# Patient Record
Sex: Male | Born: 1970 | Race: White | Hispanic: No | Marital: Married | Smoking: Never smoker
Health system: Southern US, Community
[De-identification: ages and names within clinical notes are randomized; demographics above are authoritative.]

## PROBLEM LIST (undated history)

## (undated) DIAGNOSIS — E785 Hyperlipidemia, unspecified: Secondary | ICD-10-CM

## (undated) HISTORY — PX: KNEE ARTHROSCOPY: SHX127

## (undated) HISTORY — DX: Hyperlipidemia, unspecified: E78.5

## (undated) HISTORY — PX: SHOULDER SURGERY: SHX246

---

## 2013-03-05 LAB — TESTOSTERONE: TESTOSTERONE: 252

## 2014-03-22 LAB — LIPID PANEL
CHOLESTEROL: 246 mg/dL — AB (ref 0–200)
HDL: 57 mg/dL (ref 35–70)
LDL CALC: 164 mg/dL
TRIGLYCERIDES: 123 mg/dL (ref 40–160)

## 2014-03-22 LAB — BASIC METABOLIC PANEL
Creatinine: 0.9 mg/dL (ref 0.6–1.3)
GLUCOSE: 106 mg/dL
Potassium: 4.2 mmol/L (ref 3.4–5.3)
Sodium: 144 mmol/L (ref 137–147)

## 2015-05-02 ENCOUNTER — Encounter: Payer: Self-pay | Admitting: Family Medicine

## 2015-05-02 ENCOUNTER — Ambulatory Visit (INDEPENDENT_AMBULATORY_CARE_PROVIDER_SITE_OTHER): Payer: BLUE CROSS/BLUE SHIELD | Admitting: Family Medicine

## 2015-05-02 VITALS — BP 129/88 | HR 82 | Ht 68.0 in | Wt 188.0 lb

## 2015-05-02 DIAGNOSIS — R1084 Generalized abdominal pain: Secondary | ICD-10-CM

## 2015-05-02 DIAGNOSIS — R109 Unspecified abdominal pain: Secondary | ICD-10-CM | POA: Insufficient documentation

## 2015-05-02 NOTE — Assessment & Plan Note (Signed)
Abdominal pain. His history is consistent with gastroparesis. He does not have a good reason to have gastroparesis-like diabetes. Plan for a limited laboratory workup and will obtain records from the gastroenterology office to confirm the tests that were done. If he truly does have gastroparesis he may benefit from referral [facility].

## 2015-05-02 NOTE — Progress Notes (Signed)
       Gary Callahan is a 45 y.o. male who [facility] today for Abdominal pain and bloating. Patient has a history years ago of abdominal pain and bloating. He was originally worked up by a gastroenterologist with a upper endoscopy that was reportedly normal and a gastric emptying study that was reportedly abnormal.he thinks he may have been diagnosed with gastroparesis and treated with Reglan. Reglan was effective but caused excessive sedation. Additionally he had some food allergy testing that was equivocal for garlic orange potato and tomato. These tests will be scanned into the chart.  He followed a restricted diet which helped a little. His symptoms continue. He denies severe pain vomiting or diarrhea.He feels well otherwise.   Past Medical History  Diagnosis Date  . Hyperlipidemia    Past Surgical History  Procedure Laterality Date  . Knee arthroscopy Bilateral   . Shoulder surgery Right    Social History  Substance Use Topics  . Smoking status: Never Smoker   . Smokeless tobacco: Not on file  . Alcohol Use: Not on file   family history includes Alzheimer's disease in his father and paternal grandmother; Diabetes in his father; Heart disease in his maternal uncle.  ROS as above: No headache, visual changes, , vomiting, diarrhea, constipation, dizziness, , skin rash, fevers, chills, night sweats, weight loss, swollen lymph nodes, body aches, joint swelling, muscle aches, chest pain, shortness of breath, mood changes, visual or auditory hallucinations.   Medications: Current Outpatient Prescriptions  Medication Sig Dispense Refill  . fluticasone (FLONASE) 50 MCG/ACT nasal spray Place into both nostrils daily.     No current facility-administered medications for this visit.   No Known Allergies   Exam:  BP 129/88 mmHg  Pulse 82  Ht 5\' 8"  (1.727 m)  Wt 188 lb  (85.276 kg)  BMI 28.59 kg/m2 Gen: Well NAD HEENT: EOMI,  MMM Lungs: Normal work of breathing. CTABL Heart: RRR no MRG Abd: NABS, Soft. Nondistended, Nontender Exts: Brisk capillary refill, warm and well perfused.   No results found for this or any previous visit (from the past 24 hour(s)). No results found.   Please see individual assessment and plan sections.

## 2015-05-02 NOTE — Patient Instructions (Signed)
Thank you for coming in today. Get labs,.  Send records.  We will call with results.   Gastroparesis Gastroparesis, also called delayed gastric emptying, is a condition in which food takes longer than normal to empty from the stomach. The condition is usually long-lasting (chronic). CAUSES This condition may be caused by:  An endocrine disorder, such as hypothyroidism or diabetes. Diabetes is the most common cause of this condition.  A nervous system disease, such as Parkinson disease or multiple sclerosis.  Cancer, infection, or surgery of the stomach or vagus nerve.  A connective tissue disorder, such as scleroderma.  Certain medicines. In most cases, the cause is not known. RISK FACTORS This condition is more likely to develop in:  People with certain disorders, including endocrine disorders, eating disorders, amyloidosis, and scleroderma.  People with certain diseases, including Parkinson disease or multiple sclerosis.  People with cancer or infection of the stomach or vagus nerve.  People who have had surgery on the stomach or vagus nerve.  People who take certain medicines.  Women. SYMPTOMS Symptoms of this condition include:  An early feeling of fullness when eating.  Nausea.  Weight loss.  Vomiting.  Heartburn.  Abdominal bloating.  Inconsistent blood glucose levels.  Lack of appetite.  Acid from the stomach coming up into the esophagus (gastroesophageal reflux).  Spasms of the stomach. Symptoms may come and go. DIAGNOSIS This condition is diagnosed with tests, such as:  Tests that check how long it takes food to move through the stomach and intestines. These tests include:  Upper gastrointestinal (GI) series. In this test, X-rays of the intestines are taken after you drink a liquid. The liquid makes the intestines show up better on the X-rays.  Gastric emptying scintigraphy. In this test, scans are taken after you eat food that contains a small  amount of radioactive material.  Wireless capsule GI monitoring system. This test involves swallowing a capsule that records information about movement through the stomach.  Gastric manometry. This test measures electrical and muscular activity in the stomach. It is done with a thin tube that is passed down the throat and into the stomach.  Endoscopy. This test checks for abnormalities in the lining of the stomach. It is done with a long, thin tube that is passed down the throat and into the stomach.  An ultrasound. This test can help rule out gallbladder disease or pancreatitis as a cause of your symptoms. It uses sound waves to take pictures of the inside of your body. TREATMENT There is no cure for gastroparesis. This condition may be managed with:  Treatment of the underlying condition causing the gastroparesis.  Lifestyle changes, including exercise and dietary changes. Dietary changes can include:  Changes in what and when you eat.  Eating smaller meals more often.  Eating low-fat foods.  Eating low-fiber forms of high-fiber foods, such as cooked vegetables instead of raw vegetables.  Having liquid foods in place of solid foods. Liquid foods are easier to digest.  Medicines. These may be given to control nausea and vomiting and to stimulate stomach muscles.  Getting food through a feeding tube. This may be done in severe cases.  A gastric neurostimulator. This is a device that is inserted into the body with surgery. It helps improve stomach emptying and control nausea and vomiting. HOME CARE INSTRUCTIONS  Follow your health care provider's instructions about exercise and diet.  Take medicines only as directed by your health care provider. SEEK MEDICAL CARE IF:  Your  symptoms do not improve with treatment.  You have new symptoms. SEEK IMMEDIATE MEDICAL CARE IF:  You have severe abdominal pain that does not improve with treatment.  You have nausea that does not go  away.  You cannot keep fluids down.   This information is not intended to replace advice given to you by your health care provider. Make sure you discuss any questions you have with your health care provider.   Document Released: 12/31/2004 Document Revised: 05/17/2014 Document Reviewed: 12/27/2013 Elsevier Interactive Patient Education [employer]

## 2015-05-04 LAB — LIPID PANEL
CHOLESTEROL: 270 mg/dL — AB (ref 125–200)
HDL: 61 mg/dL (ref >=40)
LDL Cholesterol: 192 mg/dL — ABNORMAL HIGH (ref <130)
TRIGLYCERIDES: 87 mg/dL (ref <150)
Total CHOL/HDL Ratio: 4.4 Ratio (ref <=5.0)
VLDL: 17 mg/dL (ref <30)

## 2015-05-04 LAB — COMPREHENSIVE METABOLIC PANEL
ALBUMIN: 4.2 g/dL (ref 3.6–5.1)
ALT: 36 U/L (ref 9–46)
AST: 22 U/L (ref 10–40)
Alkaline Phosphatase: 93 U/L (ref 40–115)
BUN: 18 mg/dL (ref 7–25)
CHLORIDE: 104 mmol/L (ref 98–110)
CO2: 25 mmol/L (ref 20–31)
CREATININE: 1.01 mg/dL (ref 0.60–1.35)
Calcium: 9.5 mg/dL (ref 8.6–10.3)
Glucose, Bld: 111 mg/dL — ABNORMAL HIGH (ref 65–99)
Potassium: 4.1 mmol/L (ref 3.5–5.3)
SODIUM: 140 mmol/L (ref 135–146)
TOTAL PROTEIN: 6.7 g/dL (ref 6.1–8.1)
Total Bilirubin: 0.7 mg/dL (ref 0.2–1.2)

## 2015-05-04 LAB — CBC
HCT: 45.6 % (ref 38.5–50.0)
HEMOGLOBIN: 15.6 g/dL (ref 13.2–17.1)
MCH: 31.5 pg (ref 27.0–33.0)
MCHC: 34.2 g/dL (ref 32.0–36.0)
MCV: 92.1 fL (ref 80.0–100.0)
MPV: 11.2 fL (ref 7.5–12.5)
Platelets: 133 10*3/uL — ABNORMAL LOW (ref 140–400)
RBC: 4.95 MIL/uL (ref 4.20–5.80)
RDW: 13.6 % (ref 11.0–15.0)
WBC: 5.4 10*3/uL (ref 3.8–10.8)

## 2015-05-04 LAB — LIPASE: Lipase: 5 U/L — ABNORMAL LOW (ref 7–60)

## 2015-05-04 LAB — HEMOGLOBIN A1C
Hgb A1c MFr Bld: 5.8 % — ABNORMAL HIGH (ref <5.7)
MEAN PLASMA GLUCOSE: 120 mg/dL

## 2015-05-04 LAB — TSH: TSH: 0.84 m[IU]/L (ref 0.40–4.50)

## 2015-05-05 ENCOUNTER — Encounter: Payer: Self-pay | Admitting: Family Medicine

## 2015-05-05 DIAGNOSIS — R7303 Prediabetes: Secondary | ICD-10-CM | POA: Insufficient documentation

## 2015-05-05 DIAGNOSIS — E559 Vitamin D deficiency, unspecified: Secondary | ICD-10-CM | POA: Insufficient documentation

## 2015-05-05 DIAGNOSIS — E785 Hyperlipidemia, unspecified: Secondary | ICD-10-CM | POA: Insufficient documentation

## 2015-05-05 LAB — VITAMIN D 25 HYDROXY (VIT D DEFICIENCY, FRACTURES): VIT D 25 HYDROXY: 23 ng/mL — AB (ref 30–100)

## 2015-05-05 MED ORDER — ATORVASTATIN CALCIUM 20 MG PO TABS: 20.0000 mg | ORAL_TABLET | Freq: Every day | ORAL | Status: DC

## 2015-05-05 NOTE — Addendum Note (Signed)
Addended by: Rodolph BongOREY, Maurissa Ambrose S on: 05/05/2015 07:29 AM   Modules accepted: Orders

## 2015-05-05 NOTE — Progress Notes (Signed)
Quick Note:  1) Vitamin D deficiency noted. Take 2000 units of vitamin D daily over-the-counter.  2) You have prediabetes. Work on a low carb diet and weight loss.  3) Cholesterol is pretty bad. Bad cholesterol is 192. It should be less than about 120 or so. I think we should start cholesterol medicine called atorvistatin. I have called this into your pharmacy.  4) Other labs were all pretty normal. ______

## 2015-05-10 ENCOUNTER — Telehealth: Payer: Self-pay

## 2015-05-10 NOTE — Telephone Encounter (Signed)
Completed and faxed biometric screening to [phone].

## 2015-06-13 ENCOUNTER — Telehealth: Payer: Self-pay

## 2015-06-13 DIAGNOSIS — R1084 Generalized abdominal pain: Secondary | ICD-10-CM

## 2015-06-13 NOTE — Telephone Encounter (Signed)
I called the patient back. Plan to refer to local GI office for further evaluation and management. He may benefit from functional GI evaluation.

## 2015-06-13 NOTE — Telephone Encounter (Signed)
Pt called questioning is you had the chance to review records we received from his former gastronterology office regarding abdominal pain and possibly gastroparesis. If so he would like to know what the plan is going forward. Please advise.

## 2015-06-16 ENCOUNTER — Encounter: Payer: Self-pay | Admitting: Family Medicine

## 2015-06-23 ENCOUNTER — Encounter: Payer: Self-pay | Admitting: Family Medicine

## 2015-06-29 ENCOUNTER — Other Ambulatory Visit: Payer: Self-pay | Admitting: Family Medicine

## 2015-07-10 ENCOUNTER — Encounter: Payer: Self-pay | Admitting: Family Medicine

## 2015-07-10 ENCOUNTER — Ambulatory Visit (INDEPENDENT_AMBULATORY_CARE_PROVIDER_SITE_OTHER): Payer: BLUE CROSS/BLUE SHIELD | Admitting: Family Medicine

## 2015-07-10 VITALS — BP 113/70 | HR 79 | Wt 189.0 lb

## 2015-07-10 DIAGNOSIS — E785 Hyperlipidemia, unspecified: Secondary | ICD-10-CM | POA: Diagnosis not present

## 2015-07-10 DIAGNOSIS — F321 Major depressive disorder, single episode, moderate: Secondary | ICD-10-CM

## 2015-07-10 DIAGNOSIS — R1084 Generalized abdominal pain: Secondary | ICD-10-CM | POA: Diagnosis not present

## 2015-07-10 DIAGNOSIS — E559 Vitamin D deficiency, unspecified: Secondary | ICD-10-CM

## 2015-07-10 DIAGNOSIS — F329 Major depressive disorder, single episode, unspecified: Secondary | ICD-10-CM | POA: Insufficient documentation

## 2015-07-10 LAB — COMPREHENSIVE METABOLIC PANEL
ALBUMIN: 4.3 g/dL (ref 3.6–5.1)
ALT: 37 U/L (ref 9–46)
AST: 18 U/L (ref 10–40)
Alkaline Phosphatase: 90 U/L (ref 40–115)
BILIRUBIN TOTAL: 0.6 mg/dL (ref 0.2–1.2)
BUN: 18 mg/dL (ref 7–25)
CO2: 27 mmol/L (ref 20–31)
CREATININE: 0.85 mg/dL (ref 0.60–1.35)
Calcium: 9 mg/dL (ref 8.6–10.3)
Chloride: 107 mmol/L (ref 98–110)
GLUCOSE: 113 mg/dL — AB (ref 65–99)
Potassium: 4.3 mmol/L (ref 3.5–5.3)
SODIUM: 143 mmol/L (ref 135–146)
Total Protein: 6.4 g/dL (ref 6.1–8.1)

## 2015-07-10 LAB — CBC
HCT: 44.9 % (ref 38.5–50.0)
HEMOGLOBIN: 15 g/dL (ref 13.2–17.1)
MCH: 31.1 pg (ref 27.0–33.0)
MCHC: 33.4 g/dL (ref 32.0–36.0)
MCV: 93 fL (ref 80.0–100.0)
MPV: 10.9 fL (ref 7.5–12.5)
Platelets: 121 10*3/uL — ABNORMAL LOW (ref 140–400)
RBC: 4.83 MIL/uL (ref 4.20–5.80)
RDW: 13.3 % (ref 11.0–15.0)
WBC: 5.8 10*3/uL (ref 3.8–10.8)

## 2015-07-10 LAB — LIPID PANEL
Cholesterol: 170 mg/dL (ref 125–200)
HDL: 59 mg/dL (ref >=40)
LDL CALC: 94 mg/dL (ref <130)
TRIGLYCERIDES: 87 mg/dL (ref <150)
Total CHOL/HDL Ratio: 2.9 Ratio (ref <=5.0)
VLDL: 17 mg/dL (ref <30)

## 2015-07-10 MED ORDER — SERTRALINE HCL 50 MG PO TABS: ORAL_TABLET | ORAL | Status: DC

## 2015-07-10 NOTE — Progress Notes (Signed)
       Gary Callahan is a 45 y.o. male who [facility] Medicine today for   Cholesterol: Patient notes he tolerates his atorvastatin. He denies any muscle aches or pain.  Abdominal pain: Patient was seen by digestive health specialists. He has a gastric emptying study pending. He's feeling better with no treatment.  Depression: Patient has fatigue and tiredness and difficulty sleeping. He also notes decreased pleasure normal activities. He feels anxious on average and has difficulty controlling worrying. Never been diagnosed with depression but notes his symptoms have made it very difficult to cope with his normal life. He notes work as a back stress or for him.  Past Medical History  Diagnosis Date  . Hyperlipidemia    Past Surgical History  Procedure Laterality Date  . Knee arthroscopy Bilateral   . Shoulder surgery Right    Social History  Substance Use Topics  . Smoking status: Never Smoker   . Smokeless tobacco: Not on file  . Alcohol Use: Not on file   family history includes Alzheimer's disease in his father and paternal grandmother; Diabetes in his father; Heart disease in his maternal uncle.  ROS as above:  Medications: Current Outpatient Prescriptions  Medication Sig Dispense Refill  . atorvastatin (LIPITOR) 20 MG tablet TAKE ONE TABLET BY MOUTH EVERY DAY 30 tablet 0  . Cholecalciferol (VITAMIN D) 2000 units tablet Take 2,000 Units by mouth daily.    . fluticasone (FLONASE) 50 MCG/ACT nasal spray Place into both nostrils daily.    . sertraline (ZOLOFT) 50 MG tablet Take 1/2 tab po qd x 1 week the increase to 1 tab po qd 30 tablet 0   No current facility-administered medications for this visit.   No Known Allergies   Exam:  BP 113/70 mmHg  Pulse 79  Wt 189 lb (85.73 kg) Gen: Well NAD HEENT: EOMI,  MMM Lungs: Normal work of breathing.  CTABL Heart: RRR no MRG Abd: NABS, Soft. Nondistended, Nontender Exts: Brisk capillary refill, warm and well perfused.  Psych: Alert and oriented normal speech thought process and affect.  Depression screen PHQ 2/9 07/10/2015  Decreased Interest 2  Down, Depressed, Hopeless 1  PHQ - 2 Score 3  Altered sleeping 3  Tired, decreased energy 3  Change in appetite 2  Feeling bad or failure about yourself  1  Trouble concentrating 2  Moving slowly or fidgety/restless 0  Suicidal thoughts 0  PHQ-9 Score 14  Difficult doing work/chores Very difficult    GAD 7 is 11    No results found for this or any previous visit (from the past 24 hour(s)). No results found.    Assessment and Plan: 45 y.o. male with  1) hyperlipidemia: Doing well. Check fasting lipids today as well as CBC and CMP.  2) abdominal pain: Per GI. 3) depression and anxiety: New diagnosis today. Moderate. Started Zoloft refer to counseling recheck in 2 weeks. 4) vitamin D deficiency: Recheck vitamin D  Discussed warning signs or symptoms. Please see discharge instructions. Patient expresses understanding.

## 2015-07-10 NOTE — Patient Instructions (Signed)
Thank you for coming in today. Take zoloft 1/2 tab for 1 week.  Increase to 1 tab after 1 week.  Return in 2 weeks.   Major Depressive Disorder Major depressive disorder is a mental illness. It also may be called clinical depression or unipolar depression. Major depressive disorder usually causes feelings of sadness, hopelessness, or helplessness. Some people with this disorder do not feel particularly sad but lose interest in doing things they used to enjoy (anhedonia). Major depressive disorder also can cause physical symptoms. It can interfere with work, school, relationships, and other normal everyday activities. The disorder varies in severity but is longer lasting and more serious than the sadness we all feel from time to time in our lives. Major depressive disorder often is triggered by stressful life events or major life changes. Examples of these triggers include divorce, loss of your job or home, a move, and the death of a family member or close friend. Sometimes this disorder occurs for no obvious reason at all. People who have family members with major depressive disorder or bipolar disorder are at higher risk for developing this disorder, with or without life stressors. Major depressive disorder can occur at any age. It may occur just once in your life (single episode major depressive disorder). It may occur multiple times (recurrent major depressive disorder). SYMPTOMS People with major depressive disorder have either anhedonia or depressed mood on nearly a daily basis for at least 2 weeks or longer. Symptoms of depressed mood include:  Feelings of sadness (blue or down in the dumps) or emptiness.  Feelings of hopelessness or helplessness.  Tearfulness or episodes of crying (may be observed by others).  Irritability (children and adolescents). In addition to depressed mood or anhedonia or both, people with this disorder have at least four of the following symptoms:  Difficulty  sleeping or sleeping too much.   Significant change (increase or decrease) in appetite or weight.   Lack of energy or motivation.  Feelings of guilt and worthlessness.   Difficulty concentrating, remembering, or making decisions.  Unusually slow movement (psychomotor retardation) or restlessness (as observed by others).   Recurrent wishes for death, recurrent thoughts of self-harm (suicide), or a suicide attempt. People with major depressive disorder commonly have persistent negative thoughts about themselves, other people, and the world. People with severe major depressive disorder may experiencedistorted beliefs or perceptions about the world (psychotic delusions). They also may see or hear things that are not real (psychotic hallucinations). DIAGNOSIS Major depressive disorder is diagnosed through an assessment by your health care provider. Your health care provider will ask aboutaspects of your daily life, such as mood,sleep, and appetite, to see if you have the diagnostic symptoms of major depressive disorder. Your health care provider may ask about your medical history and use of alcohol or drugs, including prescription medicines. Your health care provider also may do a physical exam and blood work. This is because certain medical conditions and the use of certain substances can cause major depressive disorder-like symptoms (secondary depression). Your health care provider also may refer you to a mental health specialist for further evaluation and treatment. TREATMENT It is important to recognize the symptoms of major depressive disorder and seek treatment. The following treatments can be prescribed for this disorder:   Medicine. Antidepressant medicines usually are prescribed. Antidepressant medicines are thought to correct chemical imbalances in the brain that are commonly associated with major depressive disorder. Other types of medicine may be added if the symptoms do not  respond  to antidepressant medicines alone or if psychotic delusions or hallucinations occur.  Talk therapy. Talk therapy can be helpful in treating major depressive disorder by providing support, education, and guidance. Certain types of talk therapy also can help with negative thinking (cognitive behavioral therapy) and with relationship issues that trigger this disorder (interpersonal therapy). A mental health specialist can help determine which treatment is best for you. Most people with major depressive disorder do well with a combination of medicine and talk therapy. Treatments involving electrical stimulation of the brain can be used in situations with extremely severe symptoms or when medicine and talk therapy do not work over time. These treatments include electroconvulsive therapy, transcranial magnetic stimulation, and vagal nerve stimulation.   This information is not intended to replace advice given to you by your health care provider. Make sure you discuss any questions you have with your health care provider.   Document Released: 04/27/2012 Document Revised: 01/21/2014 Document Reviewed: 04/27/2012 Elsevier Interactive Patient Education [employer] Inc[employer]

## 2015-07-11 ENCOUNTER — Encounter: Payer: Self-pay | Admitting: Family Medicine

## 2015-07-11 DIAGNOSIS — D696 Thrombocytopenia, unspecified: Secondary | ICD-10-CM | POA: Insufficient documentation

## 2015-07-11 LAB — VITAMIN D 25 HYDROXY (VIT D DEFICIENCY, FRACTURES): Vit D, 25-Hydroxy: 42 ng/mL (ref 30–100)

## 2015-07-11 MED ORDER — ATORVASTATIN CALCIUM 20 MG PO TABS: 20.0000 mg | ORAL_TABLET | Freq: Every day | ORAL | Status: DC

## 2015-07-11 NOTE — Addendum Note (Signed)
Addended by: Rodolph BongOREY, Rakim Moone S on: 07/11/2015 05:20 AM   Modules accepted: Orders

## 2015-07-11 NOTE — Progress Notes (Signed)
Quick Note:  Labs are much better. Cholesterol is normal. Kidney and liver are doing well. Vit D is normal.  Platelets are still a bit low. We will continue to follow along. ______

## 2015-07-25 ENCOUNTER — Ambulatory Visit (INDEPENDENT_AMBULATORY_CARE_PROVIDER_SITE_OTHER): Payer: BLUE CROSS/BLUE SHIELD | Admitting: Family Medicine

## 2015-07-25 ENCOUNTER — Encounter: Payer: Self-pay | Admitting: Family Medicine

## 2015-07-25 VITALS — BP 121/77 | HR 69 | Wt 185.0 lb

## 2015-07-25 DIAGNOSIS — G4700 Insomnia, unspecified: Secondary | ICD-10-CM | POA: Diagnosis not present

## 2015-07-25 DIAGNOSIS — F321 Major depressive disorder, single episode, moderate: Secondary | ICD-10-CM

## 2015-07-25 MED ORDER — TRAZODONE HCL 50 MG PO TABS: 25.0000 mg | ORAL_TABLET | Freq: Every evening | ORAL | Status: DC | PRN

## 2015-07-25 MED ORDER — SERTRALINE HCL 100 MG PO TABS: ORAL_TABLET | ORAL | Status: DC

## 2015-07-25 NOTE — Progress Notes (Signed)
       Gary Callahan is a 45 y.o. male who [facility] Medicine today for follow-up depression and anxiety. Patient was seen several weeks ago and started on Zoloft. He is increased to 50 mg of Zoloft and feels much better. He still notes some difficulty falling asleep and feels tired. Overall he notes decreased irritability. He denies any significant side effects and feels quite well.  Additionally he does note some mild difficulty with concentrating. He notes this has been going on since he started the Zoloft and is not sure if it's related to the medication or the effects of his depression.   Past Medical History  Diagnosis Date  . Hyperlipidemia    Past Surgical History  Procedure Laterality Date  . Knee arthroscopy Bilateral   . Shoulder surgery Right    Social History  Substance Use Topics  . Smoking status: Never Smoker   . Smokeless tobacco: Not on file  . Alcohol Use: Not on file   family history includes Alzheimer's disease in his father and paternal grandmother; Diabetes in his father; Heart disease in his maternal uncle.  ROS as above:  Medications: Current Outpatient Prescriptions  Medication Sig Dispense Refill  . atorvastatin (LIPITOR) 20 MG tablet Take 1 tablet (20 mg total) by mouth daily. 90 tablet 3  . Cholecalciferol (VITAMIN D) 2000 units tablet Take 2,000 Units by mouth daily.    . fluticasone (FLONASE) 50 MCG/ACT nasal spray Place into both nostrils daily.    . sertraline (ZOLOFT) 100 MG tablet Take 1/2 tab po qd x 1 week the increase to 1 tab po qd 100 tablet 1  . traZODone (DESYREL) 50 MG tablet Take 0.5-1 tablets (25-50 mg total) by mouth at bedtime as needed for sleep. 30 tablet 3   No current facility-administered medications for this visit.   No Known Allergies   Exam:  BP 121/77 mmHg  Pulse 69  Wt 185 lb (83.915 kg) Gen: Well  NAD HEENT: EOMI,  MMM Lungs: Normal work of breathing. CTABL Heart: RRR no MRG Abd: NABS, Soft. Nondistended, Nontender Exts: Brisk capillary refill, warm and well perfused.  Psych: Alert and oriented normal speech thought process and affect.  [facility] 2/[facility] 07/25/2015 07/10/2015  Decreased Interest 1 2  Down, Depressed, Hopeless 1 1  PHQ - 2 Score 2 3  Altered sleeping 3 3  Tired, decreased energy 3 3  Change in appetite 0 2  Feeling bad or failure about yourself  0 1  Trouble concentrating 1 2  Moving slowly or fidgety/restless 0 0  Suicidal thoughts 0 0  PHQ-9 Score 9 14  Difficult doing work/chores Somewhat difficult Very difficult    GAD 7 is 3  No results found for this or any previous visit (from the past 24 hour(s)). No results found.    Assessment and Plan: 45 y.o. male with improved anxiety and depression with Zoloft. Plan to increase Zoloft to 100 mg and recheck in 1 month. I'm not sure if the difficulty concentrating and is due to medication or depression. Will increase Zoloft as above and continue to follow.  Additionally patient notes insomnia that has not improved. (New issue today). We'll use a trial of trazodone at bedtime. Recheck in one month.  Discussed warning signs or symptoms. Please see discharge instructions. Patient expresses understanding.

## 2015-07-25 NOTE — Patient Instructions (Signed)
Thank you for coming in today. Increase zoloft to 100 ina few days.  Use trazodone at bedtime as needed.  Return in 1 month.

## 2015-08-05 ENCOUNTER — Other Ambulatory Visit: Payer: Self-pay | Admitting: Family Medicine

## 2015-08-28 ENCOUNTER — Encounter: Payer: Self-pay | Admitting: Family Medicine

## 2015-08-28 ENCOUNTER — Ambulatory Visit (INDEPENDENT_AMBULATORY_CARE_PROVIDER_SITE_OTHER): Payer: BLUE CROSS/BLUE SHIELD | Admitting: Family Medicine

## 2015-08-28 VITALS — BP 129/84 | HR 78 | Wt 183.0 lb

## 2015-08-28 DIAGNOSIS — G4700 Insomnia, unspecified: Secondary | ICD-10-CM

## 2015-08-28 DIAGNOSIS — F321 Major depressive disorder, single episode, moderate: Secondary | ICD-10-CM | POA: Diagnosis not present

## 2015-08-28 MED ORDER — SERTRALINE HCL 100 MG PO TABS: 100.0000 mg | ORAL_TABLET | Freq: Every day | ORAL | 1 refills | Status: DC

## 2015-08-28 MED ORDER — TRAZODONE HCL 100 MG PO TABS: 50.0000 mg | ORAL_TABLET | Freq: Every evening | ORAL | 1 refills | Status: DC | PRN

## 2015-08-28 NOTE — Progress Notes (Signed)
       Gary Callahan is a 45 y.o. male who [facility] today for follow-up insomnia and depression.  Insomnia: Improved but still not quite at goal. He notes he is able to stay asleep with 50 mg of trazodone but has trouble falling asleep. He notes he tends to watch TV just prior to bed. He feels tired in the morning. No fevers or chills nausea vomiting or diarrhea.  Depression: Much better. Patient takes Zoloft 100 mg daily. No significant side effects. He feels as though he is much less irritable.   Past Medical History:  Diagnosis Date  . Hyperlipidemia    Past Surgical History:  Procedure Laterality Date  . KNEE ARTHROSCOPY Bilateral   . SHOULDER SURGERY Right    Social History  Substance Use Topics  . Smoking status: Never Smoker  . Smokeless tobacco: Not on file  . Alcohol use Not on file   family history includes Alzheimer's disease in his father and paternal grandmother; Diabetes in his father; Heart disease in his maternal uncle.   ROS as above:  Medications: Current Outpatient Prescriptions  Medication Sig Dispense Refill  . atorvastatin (LIPITOR) 20 MG tablet Take 1 tablet (20 mg total) by mouth daily. 90 tablet 3  . Cholecalciferol (VITAMIN D) 2000 units tablet Take 2,000 Units by mouth daily.    . fluticasone (FLONASE) 50 MCG/ACT nasal spray Place into both nostrils daily.    . sertraline (ZOLOFT) 100 MG tablet Take 1 tablet (100 mg total) by mouth daily. 90 tablet 1  . traZODone (DESYREL) 100 MG tablet Take 0.5-1 tablets (50-100 mg total) by mouth at bedtime as needed for sleep. 30 tablet 1   No current facility-administered medications for this visit.    No Known Allergies   Exam:  BP 129/84   Pulse 78   Wt 183 lb (83 kg)   BMI 27.83 kg/m  Gen: Well NAD Psych: Alert and oriented normal speech thought process and  affect  Dep[facility] screen [facility] 08/28/2015 07/25/2015 07/10/2015  Decreased Interest 1 1 2   Down, Depressed, Hopeless 0 1 1  PHQ - 2 Score 1 2 3   Altered sleeping 2 3 3   Tired, decreased energy 1 3 3   Change in appetite 0 0 2  Feeling bad or failure about yourself  0 0 1  Trouble concentrating 0 1 2  Moving slowly or fidgety/restless 0 0 0  Suicidal thoughts 0 0 0  PHQ-9 Score 4 9 14   Difficult doing work/chores - Somewhat difficult Very difficult    GAD 7 : Generalized Anxiety Score 08/28/2015  Nervous, Anxious, on Edge 0  Control/stop worrying 0  Worry too much - different things 0  Trouble relaxing 1  Restless 0  Easily annoyed or irritable 0  Afraid - awful might happen 0  Total GAD 7 Score 1  Anxiety Difficulty Not difficult at all      No results found for this or any previous visit (from the past 24 hour(s)). No results found.    Assessment and Plan: 45 y.o. male with  Depression: Much improved. Continue current regimen.  Insomnia improved but not yet at goal. Work on sleep hygiene and increase trazodone to 100 mg at night. Recheck in one month if not better.   No orders of the defined types were placed in this encounter.   Discussed warning signs or symptoms. Please see discharge instructions. Patient expresses understanding.

## 2015-08-28 NOTE — Patient Instructions (Signed)
Thank you for coming in today. Increase trazodone to 100mg  at night.  Continue zoloft. Return in 1 month if not better  Work on sleep hygiene.   Insomnia Insomnia is a sleep disorder that makes it difficult to fall asleep or to stay asleep. Insomnia can cause tiredness (fatigue), low energy, difficulty concentrating, mood swings, and poor performance at work or school.  There are three different ways to classify insomnia:  Difficulty falling asleep.  Difficulty staying asleep.  Waking up too early in the morning. Any type of insomnia can be long-term (chronic) or short-term (acute). Both are common. Short-term insomnia usually lasts for three months or less. Chronic insomnia occurs at least three times a week for longer than three months. CAUSES  Insomnia may be caused by another condition, situation, or substance, such as:  Anxiety.  Certain medicines.  Gastroesophageal reflux disease (GERD) or other gastrointestinal conditions.  Asthma or other breathing conditions.  Restless legs syndrome, sleep apnea, or other sleep disorders.  Chronic pain.  Menopause. This may include hot flashes.  Stroke.  Abuse of alcohol, tobacco, or illegal drugs.  Depression.  Caffeine.   Neurological disorders, such as Alzheimer disease.  An overactive thyroid (hyperthyroidism). The cause of insomnia may not be known. RISK FACTORS Risk factors for insomnia include:  Gender. Women are more commonly affected than men.  Age. Insomnia is more common as you get older.  Stress. This may involve your professional or personal life.  Income. Insomnia is more common in people with lower income.  Lack of exercise.   Irregular work schedule or night shifts.  Traveling between different time zones. SIGNS AND SYMPTOMS If you have insomnia, trouble falling asleep or trouble staying asleep is the main symptom. This may lead to other symptoms, such as:  Feeling fatigued.  Feeling  nervous about going to sleep.  Not feeling rested in the morning.  Having trouble concentrating.  Feeling irritable, anxious, or depressed. TREATMENT  Treatment for insomnia depends on the cause. If your insomnia is caused by an underlying condition, treatment will focus on addressing the condition. Treatment may also include:   Medicines to help you sleep.  Counseling or therapy.  Lifestyle adjustments. HOME CARE INSTRUCTIONS   Take medicines only as directed by your health care provider.  Keep regular sleeping and waking hours. Avoid naps.  Keep a sleep diary to help you and your health care provider figure out what could be causing your insomnia. Include:   When you sleep.  When you wake up during the night.  How well you sleep.   How rested you feel the next day.  Any side effects of medicines you are taking.  What you eat and drink.   Make your bedroom a comfortable place where it is easy to fall asleep:  Put up shades or special blackout curtains to block light from outside.  Use a white noise machine to block noise.  Keep the temperature cool.   Exercise regularly as directed by your health care provider. Avoid exercising right before bedtime.  Use relaxation techniques to manage stress. Ask your health care provider to suggest some techniques that may work well for you. These may include:  Breathing exercises.  Routines to release muscle tension.  Visualizing peaceful scenes.  Cut back on alcohol, caffeinated beverages, and cigarettes, especially close to bedtime. These can disrupt your sleep.  Do not overeat or eat spicy foods right before bedtime. This can lead to digestive discomfort that can make it  hard for you to sleep.  Limit screen use before bedtime. This includes:  Watching TV.  Using your smartphone, tablet, and computer.  Stick to a routine. This can help you fall asleep faster. Try to do a quiet activity, brush your teeth, and go  to bed at the same time each night.  Get out of bed if you are still awake after 15 minutes of trying to sleep. Keep the lights down, but try reading or doing a quiet activity. When you feel sleepy, go back to bed.  Make sure that you drive carefully. Avoid driving if you feel very sleepy.  Keep all follow-up appointments as directed by your health care provider. This is important. SEEK MEDICAL CARE IF:   You are tired throughout the day or have trouble in your daily routine due to sleepiness.  You continue to have sleep problems or your sleep problems get worse. SEEK IMMEDIATE MEDICAL CARE IF:   You have serious thoughts about hurting yourself or someone else.   This information is not intended to replace advice given to you by your health care provider. Make sure you discuss any questions you have with your health care provider.   Document Released: 12/29/1999 Document Revised: 09/21/2014 Document Reviewed: 10/01/2013 Elsevier Interactive Patient Education [employer]

## 2015-11-10 ENCOUNTER — Ambulatory Visit (INDEPENDENT_AMBULATORY_CARE_PROVIDER_SITE_OTHER): Payer: BLUE CROSS/BLUE SHIELD | Admitting: Family Medicine

## 2015-11-10 ENCOUNTER — Encounter: Payer: Self-pay | Admitting: Family Medicine

## 2015-11-10 DIAGNOSIS — M7581 Other shoulder lesions, right shoulder: Secondary | ICD-10-CM

## 2015-11-10 DIAGNOSIS — M25519 Pain in unspecified shoulder: Secondary | ICD-10-CM | POA: Insufficient documentation

## 2015-11-10 DIAGNOSIS — M899 Disorder of bone, unspecified: Secondary | ICD-10-CM

## 2015-11-10 DIAGNOSIS — M25512 Pain in left shoulder: Secondary | ICD-10-CM
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
## 2015-11-10 NOTE — Patient Instructions (Signed)
Thank you for coming in today. Attend PT.  Return in 1 month or sooner if needed for should pain recheck.    Impingement Syndrome, Rotator Cuff, Bursitis With Rehab Impingement syndrome is a condition that involves inflammation of the tendons of the rotator cuff and the subacromial bursa, that causes pain in the shoulder. The rotator cuff consists of four tendons and muscles that control much of the shoulder and upper arm function. The subacromial bursa is a fluid filled sac that helps reduce friction between the rotator cuff and one of the bones of the shoulder (acromion). Impingement syndrome is usually an overuse injury that causes swelling of the bursa (bursitis), swelling of the tendon (tendonitis), and/or a tear of the tendon (strain). Strains are classified into three categories. Grade 1 strains cause pain, but the tendon is not lengthened. Grade 2 strains include a lengthened ligament, due to the ligament being stretched or partially ruptured. With grade 2 strains there is still function, although the function may be decreased. Grade 3 strains include a complete tear of the tendon or muscle, and function is usually impaired. SYMPTOMS   Pain around the shoulder, often at the outer portion of the upper arm.  Pain that gets worse with shoulder function, especially when reaching overhead or lifting.  Sometimes, aching when not using the arm.  Pain that wakes you up at night.  Sometimes, tenderness, swelling, warmth, or redness over the affected area.  Loss of strength.  Limited motion of the shoulder, especially reaching behind the back (to the back pocket or to unhook bra) or across your body.  Crackling sound (crepitation) when moving the arm.  Biceps tendon pain and inflammation (in the front of the shoulder). Worse when bending the elbow or lifting. CAUSES  Impingement syndrome is often an overuse injury, in which chronic (repetitive) motions cause the tendons or bursa to become  inflamed. A strain occurs when a force is paced on the tendon or muscle that is greater than it can withstand. Common mechanisms of injury include: Stress from sudden increase in duration, frequency, or intensity of training.  Direct hit (trauma) to the shoulder.  Aging, erosion of the tendon with normal use.  Bony bump on shoulder (acromial spur). RISK INCREASES WITH:  Contact sports (football, wrestling, boxing).  Throwing sports (baseball, tennis, volleyball).  Weightlifting and bodybuilding.  Heavy labor.  Previous injury to the rotator cuff, including impingement.  Poor shoulder strength and flexibility.  Failure to warm up properly before activity.  Inadequate protective equipment.  Old age.  Bony bump on shoulder (acromial spur). PREVENTION   Warm up and stretch properly before activity.  Allow for adequate recovery between workouts.  Maintain physical fitness:  Strength, flexibility, and endurance.  Cardiovascular fitness.  Learn and use proper exercise technique. PROGNOSIS  If treated properly, impingement syndrome usually goes away within 6 weeks. Sometimes surgery is required.  RELATED COMPLICATIONS   Longer healing time if not properly treated, or if not given enough time to heal.  Recurring symptoms, that result in a chronic condition.  Shoulder stiffness, frozen shoulder, or loss of motion.  Rotator cuff tendon tear.  Recurring symptoms, especially if activity is resumed too soon, with overuse, with a direct blow, or when using poor technique. TREATMENT  Treatment first involves the use of ice and medicine, to reduce pain and inflammation. The use of strengthening and stretching exercises may help reduce pain with activity. These exercises may be performed at home or with a therapist. If  non-surgical treatment is unsuccessful after more than 6 months, surgery may be advised. After surgery and rehabilitation, activity is usually possible in 3  months.  MEDICATION  If pain medicine is needed, nonsteroidal anti-inflammatory medicines (aspirin and ibuprofen), or other minor pain relievers (acetaminophen), are often advised.  Do not take pain medicine for 7 days before surgery.  Prescription pain relievers may be given, if your caregiver thinks they are needed. Use only as directed and only as much as you need.  Corticosteroid injections may be given by your caregiver. These injections should be reserved for the most serious cases, because they may only be given a certain number of times. HEAT AND COLD  Cold treatment (icing) should be applied for 10 to 15 minutes every 2 to 3 hours for inflammation and pain, and immediately after activity that aggravates your symptoms. Use ice packs or an ice massage.  Heat treatment may be used before performing stretching and strengthening activities prescribed by your caregiver, physical therapist, or athletic trainer. Use a heat pack or a warm water soak. SEEK MEDICAL CARE IF:   Symptoms get worse or do not improve in 4 to 6 weeks, despite treatment.  New, unexplained symptoms develop. (Drugs used in treatment may produce side effects.) EXERCISES  RANGE OF MOTION (ROM) AND STRETCHING EXERCISES - Impingement Syndrome (Rotator Cuff  Tendinitis, Bursitis) These exercises may help you when beginning to rehabilitate your injury. Your symptoms may go away with or without further involvement from your physician, physical therapist or athletic trainer. While completing these exercises, remember:   Restoring tissue flexibility helps normal motion to return to the joints. This allows healthier, less painful movement and activity.  An effective stretch should be held for at least 30 seconds.  A stretch should never be painful. You should only feel a gentle lengthening or release in the stretched tissue. STRETCH - Flexion, Standing  Stand with good posture. With an underhand grip on your right / left  hand, and an overhand grip on the opposite hand, grasp a broomstick or cane so that your hands are a little more than shoulder width apart.  Keeping your right / left elbow straight and shoulder muscles relaxed, push the stick with your opposite hand, to raise your right / left arm in front of your body and then overhead. Raise your arm until you feel a stretch in your right / left shoulder, but before you have increased shoulder pain.  Try to avoid shrugging your right / left shoulder as your arm rises, by keeping your shoulder blade tucked down and toward your mid-back spine. Hold for __________ seconds.  Slowly return to the starting position. Repeat __________ times. Complete this exercise __________ times per day. STRETCH - Abduction, Supine  Lie on your back. With an underhand grip on your right / left hand and an overhand grip on the opposite hand, grasp a broomstick or cane so that your hands are a little more than shoulder width apart.  Keeping your right / left elbow straight and your shoulder muscles relaxed, push the stick with your opposite hand, to raise your right / left arm out to the side of your body and then overhead. Raise your arm until you feel a stretch in your right / left shoulder, but before you have increased shoulder pain.  Try to avoid shrugging your right / left shoulder as your arm rises, by keeping your shoulder blade tucked down and toward your mid-back spine. Hold for __________ seconds.  Slowly return to the starting position. Repeat __________ times. Complete this exercise __________ times per day. ROM - Flexion, Active-Assisted  Lie on your back. You may bend your knees for comfort.  Grasp a broomstick or cane so your hands are about shoulder width apart. Your right / left hand should grip the end of the stick, so that your hand is positioned "thumbs-up," as if you were about to shake hands.  Using your healthy arm to lead, raise your right / left arm  overhead, until you feel a gentle stretch in your shoulder. Hold for __________ seconds.  Use the stick to assist in returning your right / left arm to its starting position. Repeat __________ times. Complete this exercise __________ times per day.  ROM - Internal Rotation, Supine   Lie on your back on a firm surface. Place your right / left elbow about 60 degrees away from your side. Elevate your elbow with a folded towel, so that the elbow and shoulder are the same height.  Using a broomstick or cane and your strong arm, pull your right / left hand toward your body until you feel a gentle stretch, but no increase in your shoulder pain. Keep your shoulder and elbow in place throughout the exercise.  Hold for __________ seconds. Slowly return to the starting position. Repeat __________ times. Complete this exercise __________ times per day. STRETCH - Internal Rotation  Place your right / left hand behind your back, palm up.  Throw a towel or belt over your opposite shoulder. Grasp the towel with your right / left hand.  While keeping an upright posture, gently pull up on the towel, until you feel a stretch in the front of your right / left shoulder.  Avoid shrugging your right / left shoulder as your arm rises, by keeping your shoulder blade tucked down and toward your mid-back spine.  Hold for __________ seconds. Release the stretch, by lowering your healthy hand. Repeat __________ times. Complete this exercise __________ times per day. ROM - Internal Rotation   Using an underhand grip, grasp a stick behind your back with both hands.  While standing upright with good posture, slide the stick up your back until you feel a mild stretch in the front of your shoulder.  Hold for __________ seconds. Slowly return to your starting position. Repeat __________ times. Complete this exercise __________ times per day.  STRETCH - Posterior Shoulder Capsule   Stand or sit with good posture.  Grasp your right / left elbow and draw it across your chest, keeping it at the same height as your shoulder.  Pull your elbow, so your upper arm comes in closer to your chest. Pull until you feel a gentle stretch in the back of your shoulder.  Hold for __________ seconds. Repeat __________ times. Complete this exercise __________ times per day. STRENGTHENING EXERCISES - Impingement Syndrome (Rotator Cuff Tendinitis, Bursitis) These exercises may help you when beginning to rehabilitate your injury. They may resolve your symptoms with or without further involvement from your physician, physical therapist or athletic trainer. While completing these exercises, remember:  Muscles can gain both the endurance and the strength needed for everyday activities through controlled exercises.  Complete these exercises as instructed by your physician, physical therapist or athletic trainer. Increase the resistance and repetitions only as guided.  You may experience muscle soreness or fatigue, but the pain or discomfort you are trying to eliminate should never worsen during these exercises. If this pain does get worse,  stop and make sure you are following the directions exactly. If the pain is still present after adjustments, discontinue the exercise until you can discuss the trouble with your clinician.  During your recovery, avoid activity or exercises which involve actions that place your injured hand or elbow above your head or behind your back or head. These positions stress the tissues which you are trying to heal. STRENGTH - Scapular Depression and Adduction   With good posture, sit on a firm chair. Support your arms in front of you, with pillows, arm rests, or on a table top. Have your elbows in line with the sides of your body.  Gently draw your shoulder blades down and toward your mid-back spine. Gradually increase the tension, without tensing the muscles along the top of your shoulders and the back of  your neck.  Hold for __________ seconds. Slowly release the tension and relax your muscles completely before starting the next repetition.  After you have practiced this exercise, remove the arm support and complete the exercise in standing as well as sitting position. Repeat __________ times. Complete this exercise __________ times per day.  STRENGTH - Shoulder Abductors, Isometric  With good posture, stand or sit about 4-6 inches from a wall, with your right / left side facing the wall.  Bend your right / left elbow. Gently press your right / left elbow into the wall. Increase the pressure gradually, until you are pressing as hard as you can, without shrugging your shoulder or increasing any shoulder discomfort.  Hold for __________ seconds.  Release the tension slowly. Relax your shoulder muscles completely before you begin the next repetition. Repeat __________ times. Complete this exercise __________ times per day.  STRENGTH - External Rotators, Isometric  Keep your right / left elbow at your side and bend it 90 degrees.  Step into a door frame so that the outside of your right / left wrist can press against the door frame without your upper arm leaving your side.  Gently press your right / left wrist into the door frame, as if you were trying to swing the back of your hand away from your stomach. Gradually increase the tension, until you are pressing as hard as you can, without shrugging your shoulder or increasing any shoulder discomfort.  Hold for __________ seconds.  Release the tension slowly. Relax your shoulder muscles completely before you begin the next repetition. Repeat __________ times. Complete this exercise __________ times per day.  STRENGTH - Supraspinatus   Stand or sit with good posture. Grasp a __________ weight, or an exercise band or tubing, so that your hand is "thumbs-up," like you are shaking hands.  Slowly lift your right / left arm in a "V" away from  your thigh, diagonally into the space between your side and straight ahead. Lift your hand to shoulder height or as far as you can, without increasing any shoulder pain. At first, many people do not lift their hands above shoulder height.  Avoid shrugging your right / left shoulder as your arm rises, by keeping your shoulder blade tucked down and toward your mid-back spine.  Hold for __________ seconds. Control the descent of your hand, as you slowly return to your starting position. Repeat __________ times. Complete this exercise __________ times per day.  STRENGTH - External Rotators  Secure a rubber exercise band or tubing to a fixed object (table, pole) so that it is at the same height as your right / left elbow when you  are standing or sitting on a firm surface.  Stand or sit so that the secured exercise band is at your uninjured side.  Bend your right / left elbow 90 degrees. Place a folded towel or small pillow under your right / left arm, so that your elbow is a few inches away from your side.  Keeping the tension on the exercise band, pull it away from your body, as if pivoting on your elbow. Be sure to keep your body steady, so that the movement is coming only from your rotating shoulder.  Hold for __________ seconds. Release the tension in a controlled manner, as you return to the starting position. Repeat __________ times. Complete this exercise __________ times per day.  STRENGTH - Internal Rotators   Secure a rubber exercise band or tubing to a fixed object (table, pole) so that it is at the same height as your right / left elbow when you are standing or sitting on a firm surface.  Stand or sit so that the secured exercise band is at your right / left side.  Bend your elbow 90 degrees. Place a folded towel or small pillow under your right / left arm so that your elbow is a few inches away from your side.  Keeping the tension on the exercise band, pull it across your body,  toward your stomach. Be sure to keep your body steady, so that the movement is coming only from your rotating shoulder.  Hold for __________ seconds. Release the tension in a controlled manner, as you return to the starting position. Repeat __________ times. Complete this exercise __________ times per day.  STRENGTH - Scapular Protractors, Standing   Stand arms length away from a wall. Place your hands on the wall, keeping your elbows straight.  Begin by dropping your shoulder blades down and toward your mid-back spine.  To strengthen your protractors, keep your shoulder blades down, but slide them forward on your rib cage. It will feel as if you are lifting the back of your rib cage away from the wall. This is a subtle motion and can be challenging to complete. Ask your caregiver for further instruction, if you are not sure you are doing the exercise correctly.  Hold for __________ seconds. Slowly return to the starting position, resting the muscles completely before starting the next repetition. Repeat __________ times. Complete this exercise __________ times per day. STRENGTH - Scapular Protractors, Supine  Lie on your back on a firm surface. Extend your right / left arm straight into the air while holding a __________ weight in your hand.  Keeping your head and back in place, lift your shoulder off the floor.  Hold for __________ seconds. Slowly return to the starting position, and allow your muscles to relax completely before starting the next repetition. Repeat __________ times. Complete this exercise __________ times per day. STRENGTH - Scapular Protractors, Quadruped  Get onto your hands and knees, with your shoulders directly over your hands (or as close as you can be, comfortably).  Keeping your elbows locked, lift the back of your rib cage up into your shoulder blades, so your mid-back rounds out. Keep your neck muscles relaxed.  Hold this position for __________ seconds.  Slowly return to the starting position and allow your muscles to relax completely before starting the next repetition. Repeat __________ times. Complete this exercise __________ times per day.  STRENGTH - Scapular Retractors  Secure a rubber exercise band or tubing to a fixed object (table, pole),  so that it is at the height of your shoulders when you are either standing, or sitting on a firm armless chair.  With a palm down grip, grasp an end of the band in each hand. Straighten your elbows and lift your hands straight in front of you, at shoulder height. Step back, away from the secured end of the band, until it becomes tense.  Squeezing your shoulder blades together, draw your elbows back toward your sides, as you bend them. Keep your upper arms lifted away from your body throughout the exercise.  Hold for __________ seconds. Slowly ease the tension on the band, as you reverse the directions and return to the starting position. Repeat __________ times. Complete this exercise __________ times per day. STRENGTH - Shoulder Extensors   Secure a rubber exercise band or tubing to a fixed object (table, pole) so that it is at the height of your shoulders when you are either standing, or sitting on a firm armless chair.  With a thumbs-up grip, grasp an end of the band in each hand. Straighten your elbows and lift your hands straight in front of you, at shoulder height. Step back, away from the secured end of the band, until it becomes tense.  Squeezing your shoulder blades together, pull your hands down to the sides of your thighs. Do not allow your hands to go behind you.  Hold for __________ seconds. Slowly ease the tension on the band, as you reverse the directions and return to the starting position. Repeat __________ times. Complete this exercise __________ times per day.  STRENGTH - Scapular Retractors and External Rotators   Secure a rubber exercise band or tubing to a fixed object (table,  pole) so that it is at the height as your shoulders, when you are either standing, or sitting on a firm armless chair.  With a palm down grip, grasp an end of the band in each hand. Bend your elbows 90 degrees and lift your elbows to shoulder height, at your sides. Step back, away from the secured end of the band, until it becomes tense.  Squeezing your shoulder blades together, rotate your shoulders so that your upper arms and elbows remain stationary, but your fists travel upward to head height.  Hold for __________ seconds. Slowly ease the tension on the band, as you reverse the directions and return to the starting position. Repeat __________ times. Complete this exercise __________ times per day.  STRENGTH - Scapular Retractors and External Rotators, Rowing   Secure a rubber exercise band or tubing to a fixed object (table, pole) so that it is at the height of your shoulders, when you are either standing, or sitting on a firm armless chair.  With a palm down grip, grasp an end of the band in each hand. Straighten your elbows and lift your hands straight in front of you, at shoulder height. Step back, away from the secured end of the band, until it becomes tense.  Step 1: Squeeze your shoulder blades together. Bending your elbows, draw your hands to your chest, as if you are rowing a boat. At the end of this motion, your hands and elbow should be at shoulder height and your elbows should be out to your sides.  Step 2: Rotate your shoulders, to raise your hands above your head. Your forearms should be vertical and your upper arms should be horizontal.  Hold for __________ seconds. Slowly ease the tension on the band, as you reverse the directions and  return to the starting position. Repeat __________ times. Complete this exercise __________ times per day.  STRENGTH - Scapular Depressors  Find a sturdy chair without wheels, such as a dining room chair.  Keeping your feet on the floor, and  your hands on the chair arms, lift your bottom up from the seat, and lock your elbows.  Keeping your elbows straight, allow gravity to pull your body weight down. Your shoulders will rise toward your ears.  Raise your body against gravity by drawing your shoulder blades down your back, shortening the distance between your shoulders and ears. Although your feet should always maintain contact with the floor, your feet should progressively support less body weight, as you get stronger.  Hold for __________ seconds. In a controlled and slow manner, lower your body weight to begin the next repetition. Repeat __________ times. Complete this exercise __________ times per day.    This information is not intended to replace advice given to you by your health care provider. Make sure you discuss any questions you have with your health care provider.   Document Released: 12/31/2004 Document Revised: 01/21/2014 Document Reviewed: 04/14/2008 [employer] Interactive Patient Education [employer].

## 2015-11-21 ENCOUNTER — Ambulatory Visit (INDEPENDENT_AMBULATORY_CARE_PROVIDER_SITE_OTHER): Payer: BLUE CROSS/BLUE SHIELD | Admitting: Physical Therapy

## 2015-11-21 ENCOUNTER — Encounter: Payer: Self-pay | Admitting: Physical Therapy

## 2015-11-21 DIAGNOSIS — M6281 Muscle weakness (generalized): Secondary | ICD-10-CM | POA: Diagnosis not present

## 2015-11-21 DIAGNOSIS — M25511 Pain in right shoulder: Secondary | ICD-10-CM | POA: Diagnosis not present

## 2015-11-21 DIAGNOSIS — M25611 Stiffness of right shoulder, not elsewhere classified: Secondary | ICD-10-CM
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
## 2015-11-30 ENCOUNTER — Encounter: Payer: Self-pay | Admitting: Physical Therapy

## 2015-11-30 ENCOUNTER — Ambulatory Visit (INDEPENDENT_AMBULATORY_CARE_PROVIDER_SITE_OTHER): Payer: BLUE CROSS/BLUE SHIELD | Admitting: Physical Therapy

## 2015-11-30 DIAGNOSIS — M6281 Muscle weakness (generalized): Secondary | ICD-10-CM | POA: Diagnosis not present

## 2015-11-30 DIAGNOSIS — M25611 Stiffness of right shoulder, not elsewhere classified: Secondary | ICD-10-CM

## 2015-11-30 DIAGNOSIS — M25511 Pain in right shoulder: Secondary | ICD-10-CM | POA: Diagnosis not present

## 2015-11-30 NOTE — Therapy (Signed)
[facility] [place] [address] [place], [place], [address] Phone: [phone]   Fax:  [phone]  Physical Therapy Treatment  Patient Details  Name: Gary Callahan MRN: [id number] Date of Birth: [birth year] Referring Provider: Rodolph BongEvan S Corey, MD  Encounter Date: 11/30/2015      PT End of Session - 11/30/15 1050    Visit Number 2   Date for PT Re-Evaluation 01/02/16   PT Start Time 0801   PT Stop Time 0847   PT Time Calculation (min) 46 min   Activity Tolerance Patient tolerated treatment well   Behavior During Therapy [facility] for tasks assessed/performed      Past Medical History:  Diagnosis Date  . Hyperlipidemia     Past Surgical History:  Procedure Laterality Date  . KNEE ARTHROSCOPY Bilateral   . SHOULDER SURGERY Right     There were no vitals filed for this visit.      Subjective Assessment - 11/30/15 0803    Subjective Really doing about the same really. Was on vacation last week and took ibuprofen to control the pain which did well. Shoulder is stiff but seems to be getting smoother.    Currently in Pain? Yes   Pain Score 0-No pain   Pain Location Shoulder   Pain Orientation Right   Pain Descriptors / Indicators Tightness   Aggravating Factors  lifting out to the side   Pain Relieving Factors ibuprofen                         [facility] Adult PT Treatment/Exercise - 11/30/15 0001      Neck Exercises: Machines for Strengthening   UBE (Upper Arm Bike) L1 X5 min fwd     Shoulder Exercises: Prone   Retraction Strengthening;Right;10 reps   Retraction Weight (lbs) 5   Retraction Limitations 2 sets Rows   Extension Strengthening;Right;10 reps   Extension Weight (lbs) 5   Extension Limitations 2 sets with scapular retraction   Horizontal ABduction 1 Strengthening;Right;10 reps   Horizontal ABduction 1 Weight (lbs) 0   Horizontal ABduction 1 Limitations 2 sets, with external rotation     Shoulder Exercises: Stretch   Other Shoulder Stretches sleeper stretch 3 X 30 sec     Iontophoresis   Type of Iontophoresis Dexamethasone   Location Rt anterior shoulder   Dose 80 mA   Time 6 hour patch     Manual Therapy   Manual Therapy Joint mobilization   Joint Mobilization Grade 3 GH distraction, inferior, poserior glides to Rt shoulder                PT Education - 11/30/15 1050    Education provided Yes   Education Details HEP, Ionto precautions   Person(s) Educated Patient   Methods Explanation;Demonstration;Tactile cues;Verbal cues;Handout   Comprehension Verbalized understanding;Returned demonstration             PT Long Term Goals - 11/30/15 1054      PT LONG TERM GOAL #1   Title Pt to be independent with HEP for shoulder ROM and stabilization.    Time 6   Period Weeks   Status On-going     PT LONG TERM GOAL #2   Title Pt to report his pain as 0/10 with overhead reaching.    Time 6   Period Weeks   Status On-going     PT LONG TERM GOAL #3   Title Pt to have Rt shoulder  internal rotation to low thoracic level for donning jacket.    Time 6   Period Weeks   Status On-going     PT LONG TERM GOAL #4   Title Pt to have 5/5 strength throught Rt shoulder flexion for overhead lifting.    Time 6   Period Weeks   Status On-going     PT LONG TERM GOAL #5   Title FOTO score to be 26% limitation or better.    Time 6   Period Weeks   Status On-going               Plan - 11/30/15 1050    Clinical Impression Statement Pt reports some improvement with his motion but still having feeling of tightness and pain with raising arm out to the side. Pt with modest improvement with posterior capsule stretch. Attempted manual techniques with joint mobilization along with scapular stabilization exercise progression. Trial of iontophoresis started.    Rehab Potential Excellent   PT Frequency 1x / week   PT Duration 6 weeks   PT Treatment/Interventions  ADLs/Self Care Home Management;Cryotherapy;Electrical Stimulation;Iontophoresis 4mg /ml Dexamethasone;Moist Heat;Ultrasound;Patient/family education;Therapeutic exercise;Therapeutic activities;Manual techniques;Passive range of motion;Vasopneumatic Device;Taping;Dry needling   PT Next Visit Plan Check response to ionto and scapular exercises, progress RTC if able.    PT Home Exercise Plan Add gentle corner pec stretch to HEP   Consulted and Agree with Plan of Care Patient      Patient will benefit from skilled therapeutic intervention in order to improve the following deficits and impairments:  Decreased range of motion, Impaired UE functional use, Increased muscle spasms, Decreased activity tolerance, Pain, Impaired flexibility, Decreased strength, Postural dysfunction  Visit Diagnosis: Muscle weakness (generalized)  Stiffness of right shoulder, not elsewhere classified  Acute pain of right shoulder     Problem List Patient Active Problem List   Diagnosis Date Noted  . Rotator cuff tendonitis, right 11/10/2015  . Scapular dysfunction 11/10/2015  . AC joint pain 11/10/2015  . Insomnia 07/25/2015  . Platelets decreased (HCC) 07/11/2015  . Major depressive disorder, single episode 07/10/2015  . Vitamin D deficiency 05/05/2015  . Prediabetes 05/05/2015  . Hyperlipemia 05/05/2015  . Abdominal pain 05/02/2015    Delton SeeBenjamin Bronsyn Shappell, PT, CSCS 11/30/2015, 10:58 AM  [facility] [address] [place], [place], [address] Phone: [phone]   Fax:  [phone]  Name: Gary Callahan MRN: [id number] Date of Birth: 04-16-1970

## 2015-12-01 ENCOUNTER — Other Ambulatory Visit: Payer: Self-pay | Admitting: Family Medicine

## 2015-12-11 ENCOUNTER — Encounter: Payer: Self-pay | Admitting: Physical Therapy

## 2015-12-11 ENCOUNTER — Ambulatory Visit (INDEPENDENT_AMBULATORY_CARE_PROVIDER_SITE_OTHER): Payer: BLUE CROSS/BLUE SHIELD | Admitting: Physical Therapy

## 2015-12-11 DIAGNOSIS — M6281 Muscle weakness (generalized): Secondary | ICD-10-CM

## 2015-12-11 DIAGNOSIS — M25611 Stiffness of right shoulder, not elsewhere classified: Secondary | ICD-10-CM

## 2015-12-11 DIAGNOSIS — M25511 Pain in right shoulder: Secondary | ICD-10-CM | POA: Diagnosis not present

## 2015-12-11 NOTE — Therapy (Signed)
[facility] [place] [address] [place], [place], [address] Phone: [phone]   Fax:  [phone]  Physical Therapy Treatment  Patient Details  Name: Gary Callahan MRN: [id number] Date of Birth: 1970/12/04 Referring Provider: Rodolph BongEvan S Corey, MD  Encounter Date: 12/11/2015      PT End of Session - 12/11/15 0847    Visit Number 3   Number of Visits 6   Date for PT Re-Evaluation 01/02/16   PT Start Time 0800   PT Stop Time 0843   PT Time Calculation (min) 43 min   Activity Tolerance Patient tolerated treatment well   Behavior During Therapy [facility] for tasks assessed/performed      Past Medical History:  Diagnosis Date  . Hyperlipidemia     Past Surgical History:  Procedure Laterality Date  . KNEE ARTHROSCOPY Bilateral   . SHOULDER SURGERY Right     There were no vitals filed for this visit.      Subjective Assessment - 12/11/15 0803    Subjective States that he is doing a lot better, a little sore from unpacking Christmas stuff yesterday. Front of shoulder is good, a little sore in the shoulderblade.    Currently in Pain? Yes   Pain Score 1   not pain just discomfort   Pain Location Shoulder   Pain Orientation Right                         [facility] Adult PT Treatment/Exercise - 12/11/15 0001      Neck Exercises: Machines for Strengthening   UBE (Upper Arm Bike) L1 X6 (3.5 fwd, 2.5 backward)     Shoulder Exercises: Stretch   Corner Stretch 3 reps;30 seconds   Corner Stretch Limitations hands at shoulder height   Internal Rotation Stretch 30 seconds   Internal Rotation Stretch Limitations with towel, wall assist for PA pressure at elbow as needed.    Other Shoulder Stretches sleeper stretch 3 X 30 sec     Iontophoresis   Type of Iontophoresis Dexamethasone   Location Rt anterior shoulder   Dose 80 mA   Time 6 hour patch     Manual Therapy   Manual Therapy Joint mobilization   Joint  Mobilization Grade 3 GH distraction, inferior, poserior glides to Rt shoulder                PT Education - 12/11/15 0847    Education provided Yes   Education Details HEP   Person(s) Educated Patient   Methods Explanation;Demonstration;Tactile cues;Verbal cues;Handout   Comprehension Verbalized understanding             PT Long Term Goals - 12/11/15 0840      PT LONG TERM GOAL #1   Title Pt to be independent with HEP for shoulder ROM and stabilization.    Time 6   Period Weeks   Status On-going     PT LONG TERM GOAL #2   Title Pt to report his pain as 0/10 with overhead reaching.    Time 6   Period Weeks   Status On-going     PT LONG TERM GOAL #3   Title Pt to have Rt shoulder internal rotation to low thoracic level for donning jacket.    Period Weeks   Status On-going     PT LONG TERM GOAL #4   Title Pt to have 5/5 strength throught Rt shoulder flexion for overhead lifting.  Time 6   Period Weeks   Status On-going     PT LONG TERM GOAL #5   Title FOTO score to be 26% limitation or better.    Time 6   Period Weeks   Status On-going               Plan - 12/11/15 0848    Clinical Impression Statement Pt continuing to make steady progress with decreased pain and increased functional use of rt UE. Pt reports being able to put up Christmas decorations with minimal discomfort. Pt continues to have deficits with internal rotation and scapular stabilization. Will continue to follow and progress as tolerated.    Rehab Potential Excellent   PT Frequency 1x / week   PT Duration 6 weeks   PT Treatment/Interventions ADLs/Self Care Home Management;Cryotherapy;Electrical Stimulation;Iontophoresis 4mg /ml Dexamethasone;Moist Heat;Ultrasound;Patient/family education;Therapeutic exercise;Therapeutic activities;Manual techniques;Passive range of motion;Vasopneumatic Device;Taping;Dry needling   PT Next Visit Plan Continue to work on shoulder internal rotation  and posterior capsule. Check strengthening exercises.    Consulted and Agree with Plan of Care Patient      Patient will benefit from skilled therapeutic intervention in order to improve the following deficits and impairments:  Decreased range of motion, Impaired UE functional use, Increased muscle spasms, Decreased activity tolerance, Pain, Impaired flexibility, Decreased strength, Postural dysfunction  Visit Diagnosis: Muscle weakness (generalized)  Stiffness of right shoulder, not elsewhere classified  Acute pain of right shoulder     Problem List Patient Active Problem List   Diagnosis Date Noted  . Rotator cuff tendonitis, right 11/10/2015  . Scapular dysfunction 11/10/2015  . AC joint pain 11/10/2015  . Insomnia 07/25/2015  . Platelets decreased (HCC) 07/11/2015  . Major depressive disorder, single episode 07/10/2015  . Vitamin D deficiency 05/05/2015  . Prediabetes 05/05/2015  . Hyperlipemia 05/05/2015  . Abdominal pain 05/02/2015    Delton SeeBenjamin Goran Olden, PT, CSCS 12/11/2015, 10:48 AM  [facility] [address] [place], [place], [address] Phone: [phone]   Fax:  [phone]  Name: Gary Callahan MRN: [id number] Date of Birth: 1970/05/24

## 2015-12-18 ENCOUNTER — Encounter: Payer: Self-pay | Admitting: Physical Therapy

## 2015-12-18 ENCOUNTER — Ambulatory Visit (INDEPENDENT_AMBULATORY_CARE_PROVIDER_SITE_OTHER): Payer: BLUE CROSS/BLUE SHIELD | Admitting: Physical Therapy

## 2015-12-18 DIAGNOSIS — M25511 Pain in right shoulder: Secondary | ICD-10-CM | POA: Diagnosis not present

## 2015-12-18 DIAGNOSIS — M25611 Stiffness of right shoulder, not elsewhere classified: Secondary | ICD-10-CM | POA: Diagnosis not present

## 2015-12-18 DIAGNOSIS — M6281 Muscle weakness (generalized): Secondary | ICD-10-CM | POA: Diagnosis not present

## 2015-12-18 NOTE — Therapy (Signed)
[facility] [place], [place], [address] Phone: [phone]   Fax:  [phone]  Physical Therapy Treatment  Patient Details  Name: Gary Callahan MRN: [id number] Date of Birth: Jun 06, 1970 Referring Provider: Gregor Hams, MD  Encounter Date: 12/18/2015      PT End of Session - 12/18/15 1349    Visit Number 4   Number of Visits 6   Date for PT Re-Evaluation 01/02/16   PT Start Time 0850   PT Stop Time 0928   PT Time Calculation (min) 38 min   Activity Tolerance Patient tolerated treatment well   Behavior During Therapy [facility] for tasks assessed/performed      Past Medical History:  Diagnosis Date  . Hyperlipidemia     Past Surgical History:  Procedure Laterality Date  . KNEE ARTHROSCOPY Bilateral   . SHOULDER SURGERY Right     There were no vitals filed for this visit.      Subjective Assessment - 12/18/15 0852    Subjective Reports that he is feeling pretty good, no pain right now. Occastional pain when reaching arm outward. Able to rake yard and got a little sore but not bad.    Currently in Pain? (P)  No/denies            [facility] PT Assessment - 12/18/15 0001      Observation/Other Assessments   Focus on Therapeutic Outcomes (FOTO)  22% limitation     Strength   Right Shoulder Internal Rotation 4+/5   Right Shoulder External Rotation 4+/5   Left Shoulder Flexion 4+/5                     [facility] Adult PT Treatment/Exercise - 12/18/15 0001      Neck Exercises: Machines for Strengthening   UBE (Upper Arm Bike) L2 X6 (3.5 fwd, 2.5 backward)     Shoulder Exercises: Standing   External Rotation Strengthening;Right;10 reps   Theraband Level (Shoulder External Rotation) Level 3 (Green)   External Rotation Limitations in 40 degrees abduction   Internal Rotation Strengthening;Right   Theraband Level (Shoulder Internal Rotation) Level 3 (Green)   Internal Rotation Limitations in  40 degrees abduction     Shoulder Exercises: 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    Person(s) Educated Patient   Methods Explanation;Demonstration;Tactile cues;Verbal cues   Comprehension Verbalized understanding;Returned demonstration             PT Long Term Goals - 12/18/15 0919      PT LONG TERM GOAL #1   Title Pt to be independent with HEP for shoulder ROM and stabilization.    Status Achieved     PT LONG TERM GOAL #2   Title Pt to report his pain as 0/10 with overhead reaching.    Status Achieved     PT LONG TERM GOAL #3   Title Pt to have Rt shoulder internal rotation to low thoracic level for donning jacket.  Time 6   Period Weeks   Status On-going     PT LONG TERM GOAL #4   Title Pt to have 5/5 strength throught Rt shoulder flexion for overhead lifting.    Period Weeks   Status On-going     PT LONG TERM GOAL #5   Title FOTO score to be 26% limitation or better.    Status Achieved               Plan - 12/18/15 1350    Clinical Impression Statement Pt reports that he feels like he is doing a whole lot better now. He states that he feels like he can continue on independently with his HEP. Pt instructed to contact clinic with any problems, questions, or concerns. Will D/C pt to HEP. Although the pt did not fully achieve all of his goals, he can continue to work on his HEP to achieve them.    Rehab Potential Excellent   PT Frequency 1x / week   PT Duration 6 weeks   PT Treatment/Interventions ADLs/Self Care Home  Management;Cryotherapy;Electrical Stimulation;Iontophoresis 31m/ml Dexamethasone;Moist Heat;Ultrasound;Patient/family education;Therapeutic exercise;Therapeutic activities;Manual techniques;Passive range of motion;Vasopneumatic Device;Taping;Dry needling   PT Next Visit Plan Pt to be D/C to HEP   Consulted and Agree with Plan of Care Patient      Patient will benefit from skilled therapeutic intervention in order to improve the following deficits and impairments:  Decreased range of motion, Impaired UE functional use, Increased muscle spasms, Decreased activity tolerance, Pain, Impaired flexibility, Decreased strength, Postural dysfunction  Visit Diagnosis: Muscle weakness (generalized)  Stiffness of right shoulder, not elsewhere classified  Acute pain of right shoulder     Problem List Patient Active Problem List   Diagnosis Date Noted  . Rotator cuff tendonitis, right 11/10/2015  . Scapular dysfunction 11/10/2015  . AC joint pain 11/10/2015  . Insomnia 07/25/2015  . [place] decreased [place] 07/11/2015  . Major depressive disorder, single episode 07/10/2015  . Vitamin D deficiency 05/05/2015  . Prediabetes 05/05/2015  . Hyperlipemia 05/05/2015  . Abdominal pain 05/02/2015    BLinard Millers12/04/2015, 1:56 PM  [facility] [place] [address]Phone: [phone]  Fax:  [phone] Name: Gary TwedtMRN: [id number] of Birth: 1May 03, 1972  PHYSICAL THERAPY DISCHARGE SUMMARY  Visits from Start of Care: 4  Current functional level related to goals / functional outcomes: As noted above   Remaining deficits: As noted above   Education / Equipment: As noted above Plan: Patient agrees to discharge.  Patient goals were partially met. Patient is being discharged due to being pleased with the current functional level.  ?????    BCassell Clement PT, CSCS

## 2015-12-25 ENCOUNTER — Encounter: Payer: BLUE CROSS/BLUE SHIELD | Admitting: Physical Therapy

## 2016-02-10 ENCOUNTER — Other Ambulatory Visit: Payer: Self-pay | Admitting: Family Medicine

## 2016-02-10 DIAGNOSIS — F321 Major depressive disorder, single episode, moderate: Secondary | ICD-10-CM

## 2016-03-12 ENCOUNTER — Other Ambulatory Visit: Payer: Self-pay | Admitting: Family Medicine

## 2016-03-12 DIAGNOSIS — F321 Major depressive disorder, single episode, moderate: Secondary | ICD-10-CM

## 2016-03-21 ENCOUNTER — Ambulatory Visit (INDEPENDENT_AMBULATORY_CARE_PROVIDER_SITE_OTHER): Payer: BLUE CROSS/BLUE SHIELD | Admitting: Family Medicine

## 2016-03-21 ENCOUNTER — Ambulatory Visit (INDEPENDENT_AMBULATORY_CARE_PROVIDER_SITE_OTHER): Payer: BLUE CROSS/BLUE SHIELD

## 2016-03-21 ENCOUNTER — Encounter: Payer: Self-pay | Admitting: Family Medicine

## 2016-03-21 VITALS — BP 121/83 | HR 76 | Wt 193.0 lb

## 2016-03-21 DIAGNOSIS — G250 Essential tremor: Secondary | ICD-10-CM

## 2016-03-21 DIAGNOSIS — R1012 Left upper quadrant pain: Secondary | ICD-10-CM | POA: Diagnosis not present

## 2016-03-21 DIAGNOSIS — F325 Major depressive disorder, single episode, in full remission: Secondary | ICD-10-CM

## 2016-03-21 DIAGNOSIS — E785 Hyperlipidemia, unspecified: Secondary | ICD-10-CM

## 2016-03-21 DIAGNOSIS — F321 Major depressive disorder, single episode, moderate: Secondary | ICD-10-CM

## 2016-03-21 MED ORDER — SERTRALINE HCL 100 MG PO TABS: 100.0000 mg | ORAL_TABLET | Freq: Every day | ORAL | 3 refills | Status: DC

## 2016-03-21 MED ORDER — TRAZODONE HCL 100 MG PO TABS: 100.0000 mg | ORAL_TABLET | Freq: Every day | ORAL | 3 refills | Status: AC

## 2016-03-21 NOTE — Progress Notes (Signed)
Gary Callahan is a 46 y.o. male who presents to [facility] today for follow-up depression, discussed tremor and abdominal pain.  Abdominal pain: Patient has had recurrent intermittent abdominal pain. He's been diagnosed with gastroparesis previously. He notes left-sided lower to upper quadrant abdominal pain occurring intermittently. He denies fevers chills vomiting or diarrhea. He does not think he's ever had a colonoscopy.  Tremor: Patient notes a tremor occurring over the last few months. Has been worse recently. He notes tremor worse with motion. Sometimes he has a tremor when he eats her right. The tremor seems to get worse when he gets anxious and better when he rests. He does have alcohol improves the tremor some. He thinks his grandfather had a similar tremor. He denies any trouble speaking or moving.  Depression: Patient is doing extremely well with Zoloft and trazodone. He notes these medicines help control symptoms very well. He denies any SI or HI.   Past Medical History:  Diagnosis Date  . Hyperlipidemia    Past Surgical History:  Procedure Laterality Date  . KNEE ARTHROSCOPY Bilateral   . SHOULDER SURGERY Right    Social History  Substance Use Topics  . Smoking status: Never Smoker  . Smokeless tobacco: Never Used  . Alcohol use Not on file   family history includes Alzheimer's disease in his father and paternal grandmother; Diabetes in his father; Heart disease in his maternal uncle.  ROS as above:  Medications: Current Outpatient Prescriptions  Medication Sig Dispense Refill  . atorvastatin (LIPITOR) 20 MG tablet Take 1 tablet (20 mg total) by mouth daily. 90 tablet 3  . Cholecalciferol (VITAMIN D) 2000 units tablet Take 2,000 Units by mouth daily.    . fluticasone (FLONASE) 50 MCG/ACT nasal spray Place into both nostrils daily.    . sertraline  (ZOLOFT) 100 MG tablet Take 1 tablet (100 mg total) by mouth daily. 90 tablet 3  . traZODone (DESYREL) 100 MG tablet Take 1 tablet (100 mg total) by mouth at bedtime. 90 tablet 3   No current facility-administered medications for this visit.    No Known Allergies  Health Maintenance Health Maintenance  Topic Date Due  . HIV Screening  11/28/1987  . TETANUS/TDAP  11/28/1991  . INFLUENZA VACCINE  Completed     Exam:  BP 121/83   Pulse 76   Wt 193 lb (87.5 kg)   BMI 29.35 kg/m  Gen: Well NAD HEENT: EOMI,  MMM Lungs: Normal work of breathing. CTABL Heart: RRR no MRG Abd: NABS, Soft. Nondistended, Mildly tender to palpation left lower quadrant to left upper quadrant but no rebound guarding or masses palpated. Exts: Brisk capillary refill, warm and well perfused.  Neuro: Alert and oriented normal coordination motion balance and gait. Slight fine tremor noted full and extension bilaterally right worse than left. No tremor at rest. Psych: Alert and oriented normal speech thought process affect. No SI or HI expressed.   No results found for this or any previous visit (from the past 72 hour(s)). No results found.    Assessment and Plan: 46 y.o. male with  Depression: Much improved. Continue current regimen.  Tremor: Likely benign Essential tremor type. Discussed options. Plan for watchful waiting at this time.   Abdominal pain: Unclear etiology recurrent issue. Plan for ultrasound. If not better will proceed with further workup.  Orders Placed This Encounter  Procedures  . US Abdomen Complete    Standing Status:  Future    Standing Expiration Date:   05/21/2017    Order Specific Question:   Reason for Exam (SYMPTOM  OR DIAGNOSIS REQUIRED)    Answer:   eval upper left quadrent abd pain    Order Specific Question:   Preferred imaging location?    [facility]   Meds ordered this encounter  Medications  . sertraline (ZOLOFT) 100 MG tablet    Sig: Take  1 tablet (100 mg total) by mouth daily.    Dispense:  90 tablet    Refill:  3  . traZODone (DESYREL) 100 MG tablet    Sig: Take 1 tablet (100 mg total) by mouth at bedtime.    Dispense:  90 tablet    Refill:  3     Discussed warning signs or symptoms. Please see discharge instructions. Patient expresses understanding.

## 2016-03-21 NOTE — Patient Instructions (Signed)
Thank you for coming in today. We will do the ultrasound soon.  Continue depression medicines.  I think you have benign essential tremor.   Essential Tremor A tremor is trembling or shaking that you cannot control. Most tremors affect the hands or arms. Tremors can also affect the head, vocal cords, face, and other parts of the body. Essential tremor is a tremor without a known cause. What are the causes? Essential tremor has no known cause. What increases the risk? You may be at greater risk of essential tremor if:  You have a family member with essential tremor.  You are age 44 or older.  You take certain medicines. What are the signs or symptoms? The main sign of a tremor is uncontrolled and unintentional rhythmic shaking of a body part.  You may have difficulty eating with a spoon or fork.  You may have difficulty writing.  You may nod your head up and down or side to side.  You may have a quivering voice. Your tremors:  May get worse over time.  May come and go.  May be more noticeable on one side of your body.  May get worse due to stress, fatigue, caffeine, and extreme heat or cold. How is this diagnosed? Your health care provider can diagnose essential tremor based on your symptoms, medical history, and a physical examination. There is no single test to diagnose an essential tremor. However, your health care provider may perform a variety of tests to rule out other conditions. Tests may include:  Blood and urine tests.  Imaging studies of your brain, such as:  CT scan.  MRI.  A test that measures involuntary muscle movement (electromyogram). How is this treated? Your tremors may go away without treatment. Mild tremors may not need treatment if they do not affect your day-to-day life. Severe tremors may need to be treated using one or a combination of the following options:  Medicines. This may include medicine that is injected.  Lifestyle  changes.  Physical therapy. Follow these instructions at home:  Take medicines only as directed by your health care provider.  Limit alcohol intake to no more than 1 drink per day for nonpregnant women and 2 drinks per day for men. One drink equals 12 oz of beer, 5 oz of wine, or 1 oz of hard liquor.  Do not use any tobacco products, including cigarettes, chewing tobacco, or electronic cigarettes. If you need help quitting, ask your health care provider.  Take medicines only as directed by your health care provider.  Avoid extreme heat or cold.  Limit the amount of caffeine you consumeas directed by your health care provider.  Try to get eight hours of sleep each night.  Find ways to manage your stress, such as meditation or yoga.  Keep all follow-up visits as directed by your health care provider. This is important. This includes any physical therapy visits. Contact a health care provider if:  You experience any changes in the location or intensity of your tremors.  You start having a tremor after starting a new medicine.  You have tremor with other symptoms such as:  Numbness.  Tingling.  Pain.  Weakness.  Your tremor gets worse.  Your tremor interferes with your daily life. This information is not intended to replace advice given to you by your health care provider. Make sure you discuss any questions you have with your health care provider. Document Released: 01/21/2014 Document Revised: 06/08/2015 Document Reviewed: 06/28/2013 Elsevier Interactive Patient  Education  2017 [employer]  

## 2016-11-06 ENCOUNTER — Other Ambulatory Visit: Payer: Self-pay | Admitting: Family Medicine

## 2016-12-11 ENCOUNTER — Other Ambulatory Visit: Payer: Self-pay | Admitting: Family Medicine

## 2017-01-09 ENCOUNTER — Other Ambulatory Visit: Payer: Self-pay | Admitting: Family Medicine

## 2017-02-06 ENCOUNTER — Other Ambulatory Visit: Payer: Self-pay | Admitting: Family Medicine

## 2017-03-07 ENCOUNTER — Other Ambulatory Visit: Payer: Self-pay | Admitting: Family Medicine

## 2017-04-09 ENCOUNTER — Other Ambulatory Visit: Payer: Self-pay | Admitting: Family Medicine

## 2017-04-09 DIAGNOSIS — F321 Major depressive disorder, single episode, moderate: Secondary | ICD-10-CM

## 2017-04-28 IMAGING — US US ABDOMEN COMPLETE
1 series · 14 of 25 positions shown · non-contrast
Comparison: None available

CLINICAL DATA: C/o chronic LUQ pain x 15 yrs worsening over last
1-2 yrs. Hx gastroparesis, HLD

EXAM:
COMPLETE ABDOMINAL ULTRASOUND

[Series 1: us abdomen complete · 0.20mm/px · 14 of 63 slices shown]
[im 1/63]
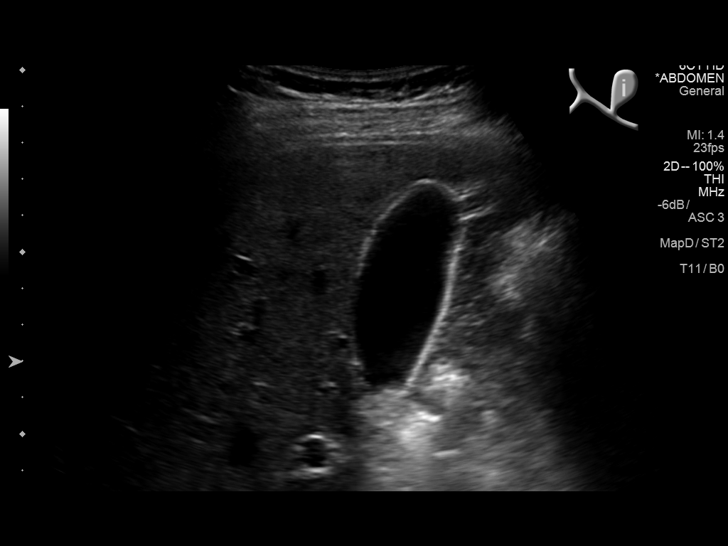
[im 6/63]
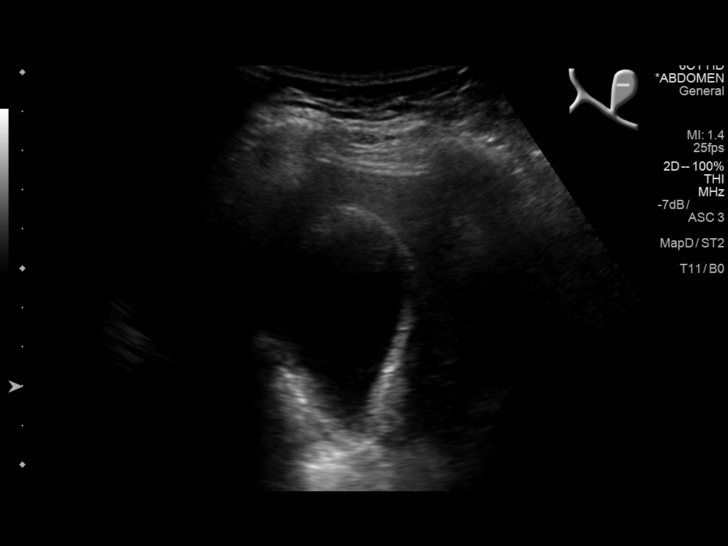
[im 11/63]
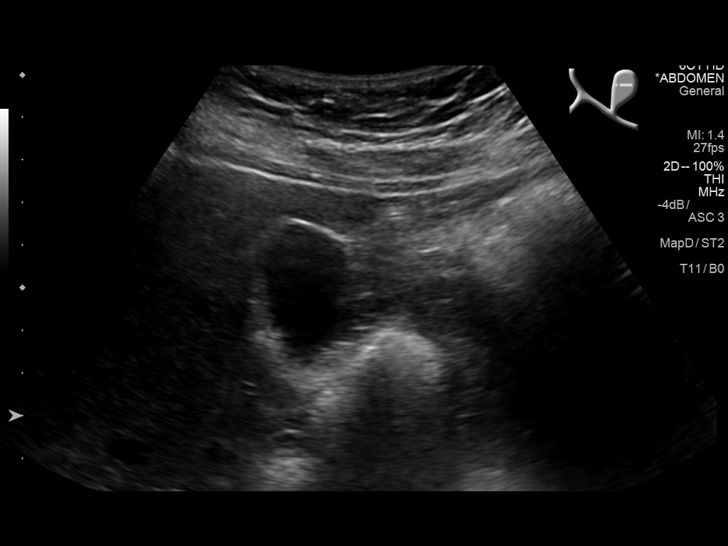
[im 16/63]
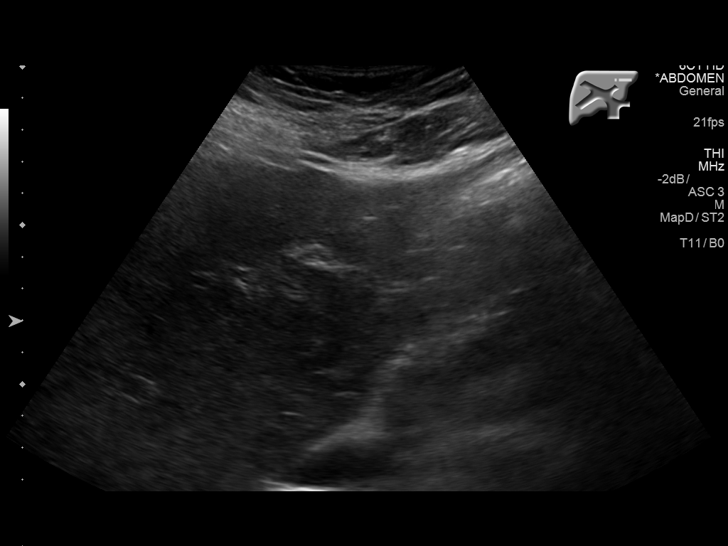
[im 21/63]
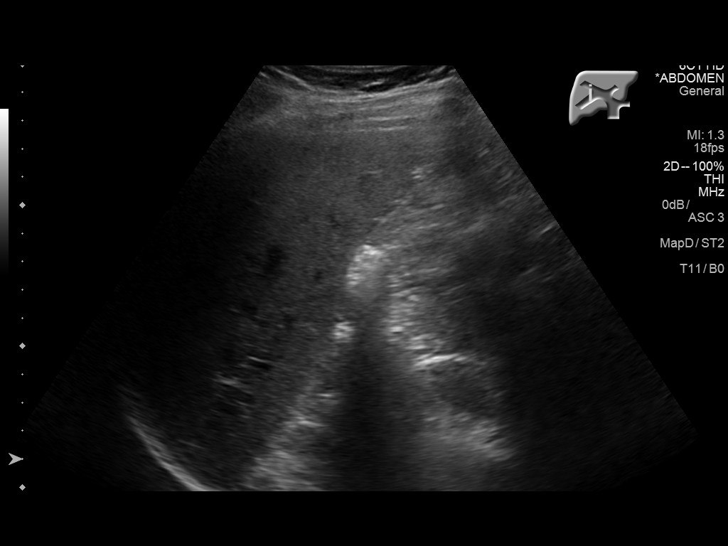
[im 24/63]
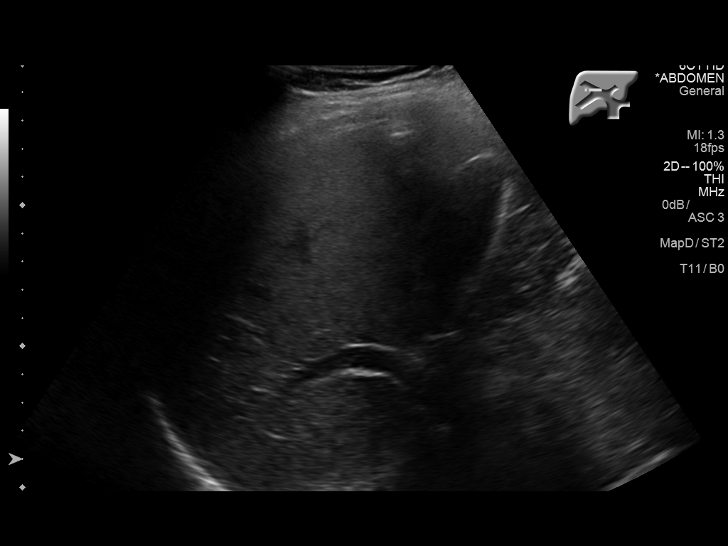
[im 29/63]
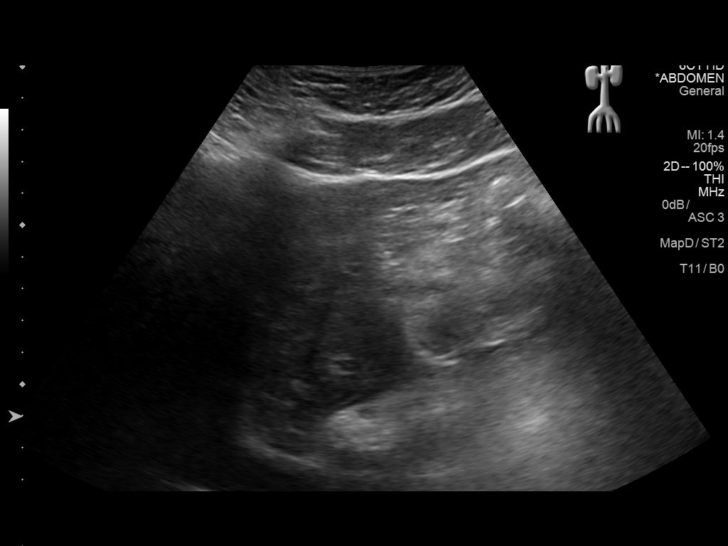
[im 34/63]
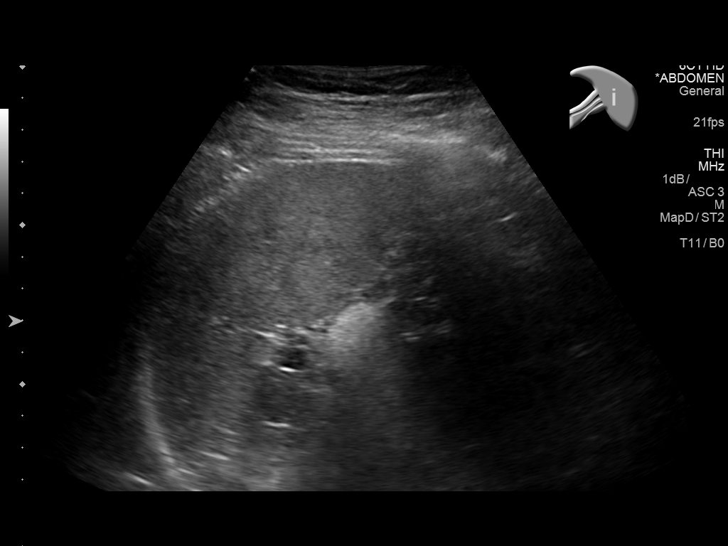
[im 39/63]
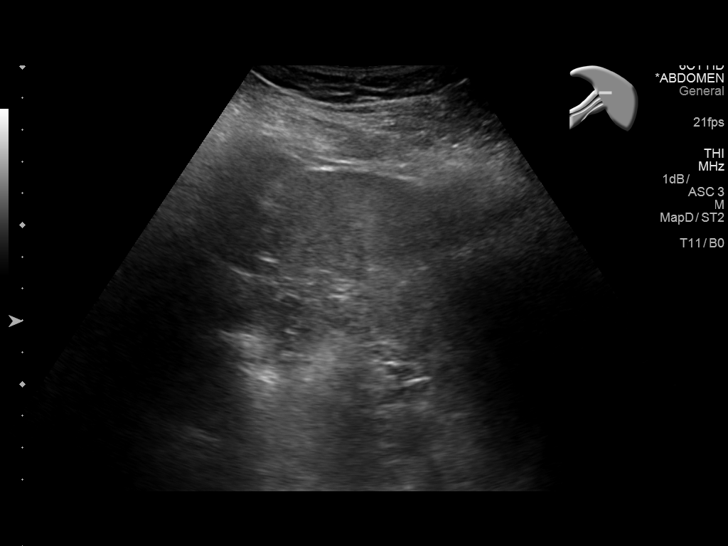
[im 42/63]
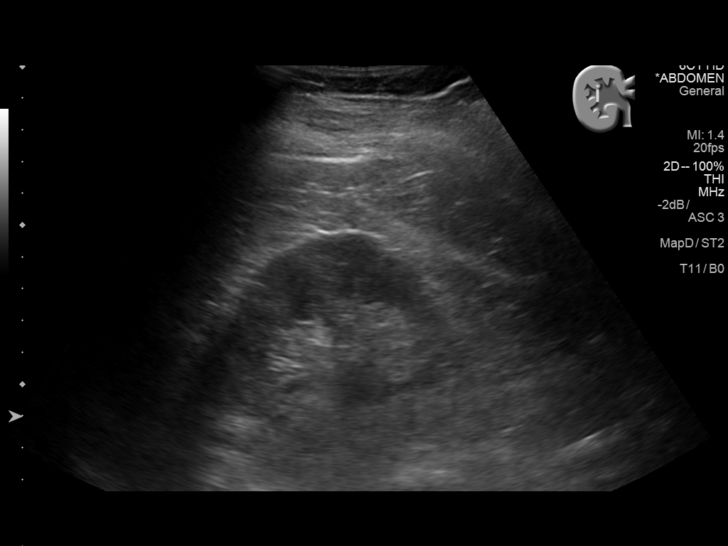
[im 47/63]
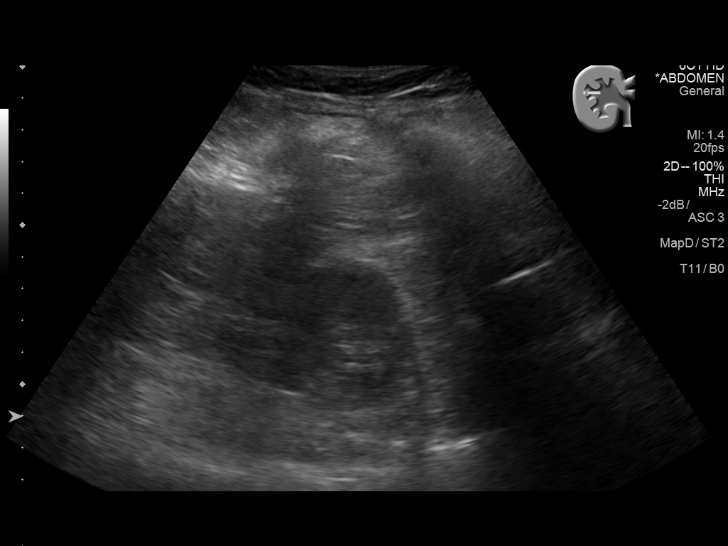
[im 52/63]
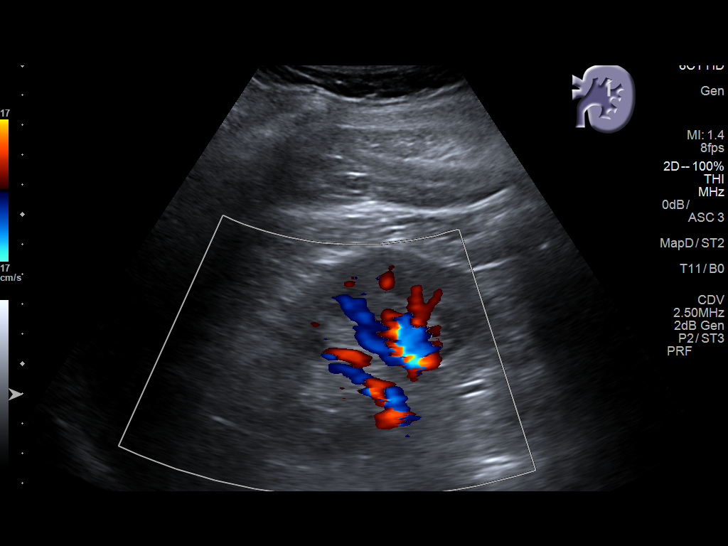
[im 57/63]
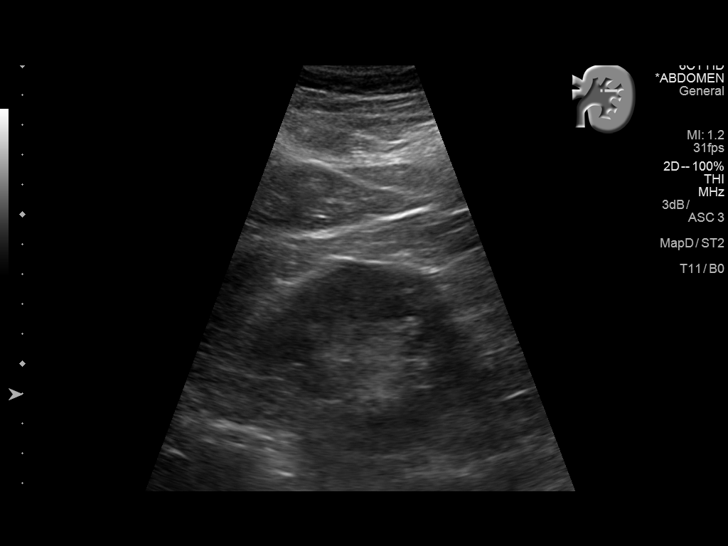
[im 63/63]
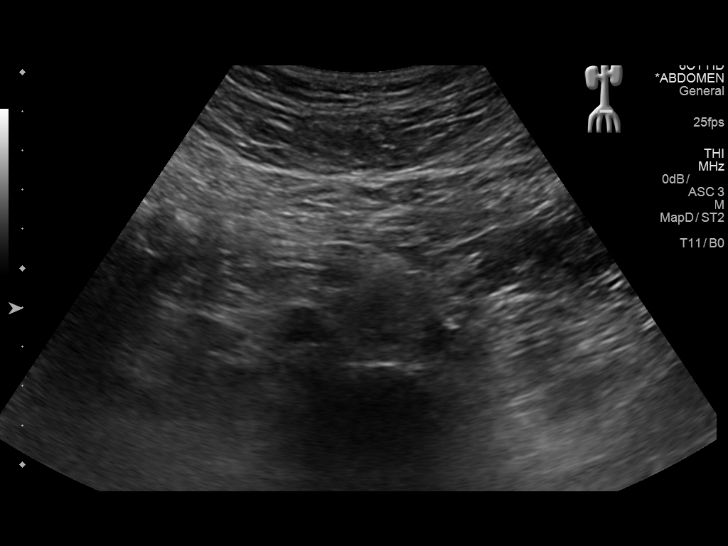

[14 of 25 positions shown; findings below may reference images not displayed]

FINDINGS: Gallbladder: Physiologically distended without stones, wall
thickening, or pericholecystic fluid. Sonographer reports no
sonographic Murphy's sign.

Common bile duct:  Normal in caliber, 2mm diameter.

Liver: Homogeneous in echotexture without focal lesion or
intrahepatic bile duct dilatation.

IVC:  Negative

Pancreas: Visualized segments unremarkable, portions obscured by
overlying bowel gas.

Spleen:  No focal lesion, craniocaudal 7cm in length.

Right Kidney:  No mass or hydronephrosis, 9.9cm in length.

Left Kidney:  No lesion or hydronephrosis, 10.1cm in length.

Abdominal aorta: Visualized segments unremarkable, portions obscured
by overlying bowel gas. No definite aneurysm.
IMPRESSION: Negative.  Normal gallbladder.

## 2018-04-04 ENCOUNTER — Other Ambulatory Visit: Payer: Self-pay | Admitting: Family Medicine

## 2018-04-04 DIAGNOSIS — F321 Major depressive disorder, single episode, moderate: Secondary | ICD-10-CM
